# Patient Record
Sex: Male | Born: 1965 | Race: White | Hispanic: Yes | Marital: Married | Smoking: Never smoker
Health system: Southern US, Community
[De-identification: ages and names within clinical notes are randomized; demographics above are authoritative.]

## PROBLEM LIST (undated history)

## (undated) DIAGNOSIS — Z86010 Personal history of colonic polyps: Secondary | ICD-10-CM

## (undated) DIAGNOSIS — K760 Fatty (change of) liver, not elsewhere classified: Secondary | ICD-10-CM

## (undated) DIAGNOSIS — K5792 Diverticulitis of intestine, part unspecified, without perforation or abscess without bleeding: Secondary | ICD-10-CM

## (undated) HISTORY — DX: Fatty (change of) liver, not elsewhere classified: K76.0

## (undated) HISTORY — DX: Diverticulitis of intestine, part unspecified, without perforation or abscess without bleeding: K57.92

## (undated) HISTORY — DX: Personal history of colonic polyps: Z86.010

---

## 1998-01-14 ENCOUNTER — Encounter: Admission: RE | Admit: 1998-01-14 | Discharge: 1998-02-18 | Payer: Self-pay | Admitting: Orthopedic Surgery

## 1998-08-29 ENCOUNTER — Ambulatory Visit (HOSPITAL_COMMUNITY): Admission: RE | Admit: 1998-08-29 | Discharge: 1998-08-29 | Payer: Self-pay

## 1998-08-29 ENCOUNTER — Encounter: Payer: Self-pay | Admitting: Neurosurgery

## 1998-09-22 ENCOUNTER — Encounter: Payer: Self-pay | Admitting: Neurosurgery

## 1998-09-22 ENCOUNTER — Ambulatory Visit (HOSPITAL_COMMUNITY): Admission: RE | Admit: 1998-09-22 | Discharge: 1998-09-22 | Payer: Self-pay | Admitting: Neurosurgery

## 1999-06-19 ENCOUNTER — Ambulatory Visit (HOSPITAL_COMMUNITY): Admission: RE | Admit: 1999-06-19 | Discharge: 1999-06-19 | Payer: Self-pay | Admitting: Family Medicine

## 1999-06-19 ENCOUNTER — Encounter: Payer: Self-pay | Admitting: Family Medicine

## 2000-02-12 ENCOUNTER — Ambulatory Visit (HOSPITAL_COMMUNITY): Admission: RE | Admit: 2000-02-12 | Discharge: 2000-02-12 | Payer: Self-pay | Admitting: Neurosurgery

## 2000-02-12 ENCOUNTER — Encounter: Payer: Self-pay | Admitting: Neurosurgery

## 2002-03-27 ENCOUNTER — Emergency Department (HOSPITAL_COMMUNITY): Admission: EM | Admit: 2002-03-27 | Discharge: 2002-03-27 | Payer: Self-pay | Admitting: Emergency Medicine

## 2003-12-04 ENCOUNTER — Ambulatory Visit: Payer: Self-pay | Admitting: Family Medicine

## 2004-01-07 ENCOUNTER — Ambulatory Visit: Payer: Self-pay | Admitting: Family Medicine

## 2005-08-22 ENCOUNTER — Emergency Department (HOSPITAL_COMMUNITY): Admission: EM | Admit: 2005-08-22 | Discharge: 2005-08-22 | Payer: Self-pay | Admitting: Emergency Medicine

## 2008-04-29 ENCOUNTER — Emergency Department (HOSPITAL_COMMUNITY): Admission: EM | Admit: 2008-04-29 | Discharge: 2008-04-29 | Payer: Self-pay | Admitting: Emergency Medicine

## 2009-02-23 ENCOUNTER — Emergency Department (HOSPITAL_COMMUNITY): Admission: EM | Admit: 2009-02-23 | Discharge: 2009-02-23 | Payer: Self-pay | Admitting: Emergency Medicine

## 2009-09-06 ENCOUNTER — Emergency Department (HOSPITAL_COMMUNITY): Admission: EM | Admit: 2009-09-06 | Discharge: 2009-09-07 | Payer: Self-pay | Admitting: Emergency Medicine

## 2009-10-10 ENCOUNTER — Emergency Department (HOSPITAL_COMMUNITY): Admission: EM | Admit: 2009-10-10 | Discharge: 2009-10-10 | Payer: Self-pay | Admitting: Emergency Medicine

## 2009-10-12 ENCOUNTER — Emergency Department (HOSPITAL_COMMUNITY): Admission: EM | Admit: 2009-10-12 | Discharge: 2009-10-12 | Payer: Self-pay | Admitting: Emergency Medicine

## 2009-10-14 ENCOUNTER — Ambulatory Visit: Payer: Self-pay | Admitting: Internal Medicine

## 2009-10-24 ENCOUNTER — Ambulatory Visit: Payer: Self-pay | Admitting: Internal Medicine

## 2009-10-24 LAB — CONVERTED CEMR LAB
ALT: 28 units/L (ref 0–53)
AST: 23 units/L (ref 0–37)
Albumin: 4.8 g/dL (ref 3.5–5.2)
Alkaline Phosphatase: 74 units/L (ref 39–117)
BUN: 15 mg/dL (ref 6–23)
CO2: 29 meq/L (ref 19–32)
Calcium: 10.3 mg/dL (ref 8.4–10.5)
Chloride: 105 meq/L (ref 96–112)
Cholesterol: 214 mg/dL — ABNORMAL HIGH (ref 0–200)
Creatinine, Ser: 0.89 mg/dL (ref 0.40–1.50)
Glucose, Bld: 118 mg/dL — ABNORMAL HIGH (ref 70–99)
HDL: 41 mg/dL (ref >39)
LDL Cholesterol: 157 mg/dL — ABNORMAL HIGH (ref 0–99)
Potassium: 5.4 meq/L — ABNORMAL HIGH (ref 3.5–5.3)
Sodium: 140 meq/L (ref 135–145)
Total Bilirubin: 1.1 mg/dL (ref 0.3–1.2)
Total CHOL/HDL Ratio: 5.2
Total Protein: 7.6 g/dL (ref 6.0–8.3)
Triglycerides: 80 mg/dL (ref <150)
VLDL: 16 mg/dL (ref 0–40)

## 2009-12-09 ENCOUNTER — Ambulatory Visit: Payer: Self-pay | Admitting: Internal Medicine

## 2010-06-04 LAB — POCT I-STAT, CHEM 8
Calcium, Ion: 1.18 mmol/L (ref 1.12–1.32)
Creatinine, Ser: 0.8 mg/dL (ref 0.4–1.5)
Glucose, Bld: 179 mg/dL — ABNORMAL HIGH (ref 70–99)
Hemoglobin: 17.7 g/dL — ABNORMAL HIGH (ref 13.0–17.0)
Sodium: 140 mEq/L (ref 135–145)
TCO2: 27 mmol/L (ref 0–100)

## 2010-06-04 LAB — DIFFERENTIAL
Eosinophils Absolute: 0.1 10*3/uL (ref 0.0–0.7)
Eosinophils Relative: 2 % (ref 0–5)
Lymphs Abs: 2.9 10*3/uL (ref 0.7–4.0)
Monocytes Relative: 4 % (ref 3–12)

## 2010-06-04 LAB — POCT CARDIAC MARKERS
CKMB, poc: <1 ng/mL — ABNORMAL LOW (ref 1.0–8.0)
Myoglobin, poc: 35.4 ng/mL (ref 12–200)

## 2010-06-04 LAB — CBC
HCT: 47.8 % (ref 39.0–52.0)
MCV: 88.2 fL (ref 78.0–100.0)
Platelets: 272 10*3/uL (ref 150–400)
WBC: 6.7 10*3/uL (ref 4.0–10.5)

## 2010-06-24 ENCOUNTER — Emergency Department: Payer: Self-pay | Admitting: Emergency Medicine

## 2010-11-24 ENCOUNTER — Emergency Department (HOSPITAL_COMMUNITY): Payer: Self-pay

## 2010-11-24 ENCOUNTER — Emergency Department (HOSPITAL_COMMUNITY)
Admission: EM | Admit: 2010-11-24 | Discharge: 2010-11-25 | Disposition: A | Discharge status: Home / Self Care | Payer: Self-pay | Attending: Emergency Medicine | Admitting: Emergency Medicine

## 2010-11-24 DIAGNOSIS — R51 Headache: Secondary | ICD-10-CM | POA: Insufficient documentation

## 2010-11-24 DIAGNOSIS — R209 Unspecified disturbances of skin sensation: Secondary | ICD-10-CM | POA: Insufficient documentation

## 2010-11-24 DIAGNOSIS — M79609 Pain in unspecified limb: Secondary | ICD-10-CM | POA: Insufficient documentation

## 2010-11-24 LAB — APTT: aPTT: 34 seconds (ref 24–37)

## 2010-11-24 LAB — CBC
Hemoglobin: 16.6 g/dL (ref 13.0–17.0)
MCHC: 35.9 g/dL (ref 30.0–36.0)
RBC: 5.36 MIL/uL (ref 4.22–5.81)

## 2010-11-24 LAB — RAPID URINE DRUG SCREEN, HOSP PERFORMED
Amphetamines: NOT DETECTED
Barbiturates: NOT DETECTED
Benzodiazepines: NOT DETECTED
Tetrahydrocannabinol: NOT DETECTED

## 2010-11-24 LAB — BASIC METABOLIC PANEL
BUN: 9 mg/dL (ref 6–23)
CO2: 27 mEq/L (ref 19–32)
GFR calc non Af Amer: >90 mL/min (ref >90)
Glucose, Bld: 136 mg/dL — ABNORMAL HIGH (ref 70–99)
Potassium: 3.3 mEq/L — ABNORMAL LOW (ref 3.5–5.1)

## 2010-11-24 LAB — DIFFERENTIAL
Basophils Absolute: 0.1 10*3/uL (ref 0.0–0.1)
Basophils Relative: 1 % (ref 0–1)
Neutro Abs: 4.8 10*3/uL (ref 1.7–7.7)
Neutrophils Relative %: 55 % (ref 43–77)

## 2010-11-24 LAB — URINALYSIS, ROUTINE W REFLEX MICROSCOPIC
Bilirubin Urine: NEGATIVE
Hgb urine dipstick: NEGATIVE
Ketones, ur: NEGATIVE mg/dL
Nitrite: NEGATIVE
pH: 6.5 (ref 5.0–8.0)

## 2010-11-24 LAB — POCT I-STAT TROPONIN I: Troponin i, poc: 0 ng/mL (ref 0.00–0.08)

## 2010-11-24 LAB — PROTIME-INR: INR: 1.02 (ref 0.00–1.49)

## 2010-11-25 ENCOUNTER — Emergency Department (HOSPITAL_COMMUNITY): Payer: Self-pay

## 2010-11-25 ENCOUNTER — Other Ambulatory Visit (HOSPITAL_COMMUNITY): Payer: Self-pay

## 2010-11-25 DIAGNOSIS — G459 Transient cerebral ischemic attack, unspecified: Secondary | ICD-10-CM

## 2010-11-25 LAB — HEMOGLOBIN A1C
Hgb A1c MFr Bld: 6.2 % — ABNORMAL HIGH (ref <5.7)
Mean Plasma Glucose: 131 mg/dL — ABNORMAL HIGH (ref <117)

## 2010-11-25 LAB — LIPID PANEL
Cholesterol: 220 mg/dL — ABNORMAL HIGH (ref 0–200)
Total CHOL/HDL Ratio: 5.2 RATIO

## 2013-03-31 ENCOUNTER — Emergency Department (HOSPITAL_COMMUNITY): Payer: Medicaid Other

## 2013-03-31 ENCOUNTER — Emergency Department (HOSPITAL_COMMUNITY)
Admission: EM | Admit: 2013-03-31 | Discharge: 2013-04-01 | Disposition: A | Discharge status: Home / Self Care | Payer: Medicaid Other | Attending: Emergency Medicine | Admitting: Emergency Medicine

## 2013-03-31 ENCOUNTER — Encounter (HOSPITAL_COMMUNITY): Payer: Self-pay | Admitting: Emergency Medicine

## 2013-03-31 DIAGNOSIS — K5792 Diverticulitis of intestine, part unspecified, without perforation or abscess without bleeding: Secondary | ICD-10-CM

## 2013-03-31 DIAGNOSIS — K5732 Diverticulitis of large intestine without perforation or abscess without bleeding: Secondary | ICD-10-CM | POA: Insufficient documentation

## 2013-03-31 LAB — POCT I-STAT, CHEM 8
BUN: 10 mg/dL (ref 6–23)
CALCIUM ION: 1.12 mmol/L (ref 1.12–1.23)
Chloride: 100 mEq/L (ref 96–112)
Creatinine, Ser: 0.7 mg/dL (ref 0.50–1.35)
Glucose, Bld: 170 mg/dL — ABNORMAL HIGH (ref 70–99)
HEMATOCRIT: 54 % — AB (ref 39.0–52.0)
Hemoglobin: 18.4 g/dL — ABNORMAL HIGH (ref 13.0–17.0)
Potassium: 4.7 mEq/L (ref 3.7–5.3)
Sodium: 137 mEq/L (ref 137–147)
TCO2: 28 mmol/L (ref 0–100)

## 2013-03-31 LAB — CBC
HEMATOCRIT: 48.4 % (ref 39.0–52.0)
Hemoglobin: 17.3 g/dL — ABNORMAL HIGH (ref 13.0–17.0)
MCH: 31 pg (ref 26.0–34.0)
MCHC: 35.7 g/dL (ref 30.0–36.0)
MCV: 86.7 fL (ref 78.0–100.0)
Platelets: 255 10*3/uL (ref 150–400)
RBC: 5.58 MIL/uL (ref 4.22–5.81)
RDW: 12.4 % (ref 11.5–15.5)
WBC: 14 10*3/uL — ABNORMAL HIGH (ref 4.0–10.5)

## 2013-03-31 LAB — OCCULT BLOOD, POC DEVICE: FECAL OCCULT BLD: NEGATIVE

## 2013-03-31 MED ORDER — FENTANYL CITRATE 0.05 MG/ML IJ SOLN
50.0000 ug | INTRAMUSCULAR | Status: DC | PRN
  Administered 2013-03-31: 50 ug via INTRAVENOUS
  Filled 2013-03-31 (×2): qty 2

## 2013-03-31 MED ORDER — SODIUM CHLORIDE 0.9 % IV SOLN
INTRAVENOUS | Status: DC
  Administered 2013-03-31: via INTRAVENOUS

## 2013-03-31 MED ORDER — IOHEXOL 300 MG/ML  SOLN
50.0000 mL | Freq: Once | INTRAMUSCULAR | Status: AC | PRN
  Administered 2013-03-31: 50 mL via ORAL

## 2013-03-31 MED ORDER — ONDANSETRON HCL 4 MG/2ML IJ SOLN
4.0000 mg | Freq: Once | INTRAMUSCULAR | Status: AC
  Administered 2013-03-31: 4 mg via INTRAVENOUS
  Filled 2013-03-31: qty 2

## 2013-03-31 NOTE — ED Provider Notes (Signed)
CSN: [id number]     Arrival date & time 03/31/13  2111 History   First MD Initiated Contact with Patient 03/31/13 2259     Chief Complaint  Patient presents with  . Rectal Bleeding   (Consider location/radiation/quality/duration/timing/severity/associated sxs/prior Treatment) HPI Hx per PT - lower ABD pain onset yesterday mild more severe today with constipation - small hard stool with streaks of blood. No F/C, no N/V, pain is sharp and constant with tenesmus. No h/o same. No radiation of pain, some urinary frequency. No dysuria or hematuria. Last BM today.   History reviewed. No pertinent past medical history. History reviewed. No pertinent past surgical history. History reviewed. No pertinent family history. History  Substance Use Topics  . Smoking status: Never Smoker   . Smokeless tobacco: Not on file  . Alcohol Use: Yes    Review of Systems  Constitutional: Negative for fever and chills.  Respiratory: Negative for shortness of breath.   Cardiovascular: Negative for chest pain.  Gastrointestinal: Positive for abdominal pain, constipation and blood in stool. Negative for vomiting.  Genitourinary: Negative for hematuria and flank pain.  Musculoskeletal: Negative for back pain, neck pain and neck stiffness.  Skin: Negative for rash.  Neurological: Negative for headaches.  All other systems reviewed and are negative.    Allergies  Codeine  Home Medications   Current Outpatient Rx  Name  Route  Sig  Dispense  Refill  . ibuprofen (ADVIL,MOTRIN) 200 MG tablet   Oral   Take 600 mg by mouth every 6 (six) hours as needed (pain).          BP 143/81  Pulse 98  Temp(Src) 100.8 F (38.2 C) (Oral)  Resp 18  SpO2 98% Physical Exam  Constitutional: He is oriented to person, place, and time. He appears well-developed and well-nourished.  HENT:  Head: Normocephalic and atraumatic.  Mouth/Throat: Oropharynx is clear and moist.  Eyes: EOM are normal. Pupils are equal, round,  and reactive to light. No scleral icterus.  Neck: Neck supple.  Cardiovascular: Normal rate, regular rhythm and intact distal pulses.   Pulmonary/Chest: Effort normal and breath sounds normal. No respiratory distress.  Abdominal: Soft. Bowel sounds are normal. He exhibits no distension.  TTP RLQ/ LLQ and midline lower ABD. No fullness over the bladder  Musculoskeletal: Normal range of motion. He exhibits no edema.  Neurological: He is alert and oriented to person, place, and time. No cranial nerve deficit.  Skin: Skin is warm and dry.    ED Course  Procedures (including critical care time) Labs Review Labs Reviewed  CBC - Abnormal; Notable for the following:    WBC 14.0 (*)    Hemoglobin 17.3 (*)    All other components within normal limits  COMPREHENSIVE METABOLIC PANEL - Abnormal; Notable for the following:    Glucose, Bld 175 (*)    All other components within normal limits  POCT I-STAT, CHEM 8 - Abnormal; Notable for the following:    Glucose, Bld 170 (*)    Hemoglobin 18.4 (*)    HCT 54.0 (*)    All other components within normal limits  URINALYSIS, ROUTINE W REFLEX MICROSCOPIC  OCCULT BLOOD, POC DEVICE  TYPE AND SCREEN  ABO/RH   Imaging Review Ct Abdomen Pelvis W Contrast  04/01/2013   CLINICAL DATA:  48 year old male with abdominal and pelvic pain with rectal bleeding.  EXAM: CT ABDOMEN AND PELVIS WITH CONTRAST  TECHNIQUE: Multidetector CT imaging of the abdomen and pelvis was performed using the  standard protocol following bolus administration of intravenous contrast.  CONTRAST:  100 cc intravenous Omnipaque 300  COMPARISON:  None.  FINDINGS: Focal wall thickening of the mid sigmoid colon with adjacent inflammation is compatible with diverticulitis. There is no evidence of pneumoperitoneum, bowel obstruction or focal abscess.  Diffuse fatty infiltration of the liver is noted.  The gallbladder, spleen, pancreas, adrenal glands and kidneys are unremarkable.  The bladder and  appendix are unremarkable.  There is no evidence of free fluid, enlarged lymph nodes, biliary dilation or abdominal aortic aneurysm.  No acute bony abnormalities are present. 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  IMPRESSION: Diverticulitis of the mid sigmoid colon without pneumoperitoneum or focal abscess. Clinical follow-up to resolution is recommended.  Diffuse fatty infiltration of the liver.  Bilateral L5 pars defects.   Electronically Signed   By: Hassan Rowan M.D.   On: 04/01/2013 00:34   IV fluids. IV fentanyl. Zofran  CT scan reviewed and Cipro and Flagyl provided 2:34 AM on recheck pain is improving and patient feels comfortable with plan discharge home. He agrees to strict return precautions for any fevers, severe pain, vomiting. Outpatient referral provided. Plan prescription for Cipro, Flagyl and hydrocodone as needed.   MDM  Diagnosis: Diverticulitis  Symptoms improved with IV fluids and IV narcotics. PO ABx provided Evaluated with labs, urinalysis and CT scan reviewed as above Serial evaluations. Vital signs and nursing notes reviewed and considered.  [name], MD 04/01/13 [phone]

## 2013-03-31 NOTE — ED Notes (Signed)
Pt arrived to the ED with a complaint of rectal bleeding.  Pt states he has lower abdominal pain.  Pt states he has not had a normal bowel movement in two weeks.  Pt states at present his bowel movements are small clear streaked with blood.  Pt has been drinking water and has been able to urinate fine but urination causes him abdominal pain.

## 2013-04-01 LAB — URINALYSIS, ROUTINE W REFLEX MICROSCOPIC
Bilirubin Urine: NEGATIVE
Glucose, UA: NEGATIVE mg/dL
Hgb urine dipstick: NEGATIVE
Ketones, ur: NEGATIVE mg/dL
LEUKOCYTES UA: NEGATIVE
NITRITE: NEGATIVE
PH: 6.5 (ref 5.0–8.0)
Protein, ur: NEGATIVE mg/dL
Specific Gravity, Urine: 1.008 (ref 1.005–1.030)
UROBILINOGEN UA: 0.2 mg/dL (ref 0.0–1.0)

## 2013-04-01 LAB — COMPREHENSIVE METABOLIC PANEL
ALBUMIN: 4.7 g/dL (ref 3.5–5.2)
ALK PHOS: 110 U/L (ref 39–117)
ALT: 32 U/L (ref 0–53)
AST: 21 U/L (ref 0–37)
BUN: 9 mg/dL (ref 6–23)
CO2: 25 mEq/L (ref 19–32)
Calcium: 9.7 mg/dL (ref 8.4–10.5)
Chloride: 96 mEq/L (ref 96–112)
Creatinine, Ser: 0.7 mg/dL (ref 0.50–1.35)
GFR calc Af Amer: >90 mL/min (ref >90)
GFR calc non Af Amer: >90 mL/min (ref >90)
Glucose, Bld: 175 mg/dL — ABNORMAL HIGH (ref 70–99)
Potassium: 3.7 mEq/L (ref 3.7–5.3)
Sodium: 137 mEq/L (ref 137–147)
TOTAL PROTEIN: 8.1 g/dL (ref 6.0–8.3)
Total Bilirubin: 1.2 mg/dL (ref 0.3–1.2)

## 2013-04-01 LAB — TYPE AND SCREEN
ABO/RH(D): O POS
Antibody Screen: NEGATIVE

## 2013-04-01 LAB — ABO/RH: ABO/RH(D): O POS

## 2013-04-01 MED ORDER — METRONIDAZOLE 500 MG PO TABS: 500.0000 mg | ORAL_TABLET | Freq: Two times a day (BID) | ORAL | Status: DC

## 2013-04-01 MED ORDER — CIPROFLOXACIN HCL 500 MG PO TABS: 500.0000 mg | ORAL_TABLET | Freq: Two times a day (BID) | ORAL | Status: DC

## 2013-04-01 MED ORDER — IOHEXOL 300 MG/ML  SOLN
100.0000 mL | Freq: Once | INTRAMUSCULAR | Status: AC | PRN
  Administered 2013-04-01: 100 mL via INTRAVENOUS

## 2013-04-01 MED ORDER — CIPROFLOXACIN HCL 500 MG PO TABS
500.0000 mg | ORAL_TABLET | Freq: Once | ORAL | Status: AC
  Administered 2013-04-01: 500 mg via ORAL
  Filled 2013-04-01: qty 1

## 2013-04-01 MED ORDER — HYDROCODONE-ACETAMINOPHEN 5-325 MG PO TABS: 2.0000 | ORAL_TABLET | ORAL | Status: DC | PRN

## 2013-04-01 MED ORDER — METRONIDAZOLE 500 MG PO TABS
500.0000 mg | ORAL_TABLET | Freq: Once | ORAL | Status: AC
  Administered 2013-04-01: 500 mg via ORAL
  Filled 2013-04-01: qty 1

## 2013-04-01 NOTE — Discharge Instructions (Signed)
Diverticulitis °A diverticulum is a small pouch or sac on the colon. Diverticulosis is the presence of these diverticula on the colon. Diverticulitis is the irritation (inflammation) or infection of diverticula. °CAUSES  °The colon and its diverticula contain bacteria. If food particles block the tiny opening to a diverticulum, the bacteria inside can grow and cause an increase in pressure. This leads to infection and inflammation and is called diverticulitis. °SYMPTOMS  °· Abdominal pain and tenderness. Usually, the pain is located on the left side of your abdomen. However, it could be located elsewhere. °· Fever. °· Bloating. °· Feeling sick to your stomach (nausea). °· Throwing up (vomiting). °· Abnormal stools. °DIAGNOSIS  °Your caregiver will take a history and perform a physical exam. Since many things can cause abdominal pain, other tests may be necessary. Tests may include: °· Blood tests. °· Urine tests. °· X-ray of the abdomen. °· CT scan of the abdomen. °Sometimes, surgery is needed to determine if diverticulitis or other conditions are causing your symptoms. °TREATMENT  °Most of the time, you can be treated without surgery. Treatment includes: °· Resting the bowels by only having liquids for a few days. As you improve, you will need to eat a low-fiber diet. °· Intravenous (IV) fluids if you are losing body fluids (dehydrated). °· Antibiotic medicines that treat infections may be given. °· Pain and nausea medicine, if needed. °· Surgery if the inflamed diverticulum has burst. °HOME CARE INSTRUCTIONS  °· Try a clear liquid diet (broth, tea, or water for as long as directed by your caregiver). You may then gradually begin a low-fiber diet as tolerated.  °A low-fiber diet is a diet with less than 10 grams of fiber. Choose the foods below to reduce fiber in the diet: °· White breads, cereals, rice, and pasta. °· Cooked fruits and vegetables or soft fresh fruits and vegetables without the skin. °· Ground or  well-cooked tender beef, ham, veal, lamb, pork, or poultry. °· Eggs and seafood. °· After your diverticulitis symptoms have improved, your caregiver may put you on a high-fiber diet. A high-fiber diet includes 14 grams of fiber for every 1000 calories consumed. For a standard 2000 calorie diet, you would need 28 grams of fiber. Follow these diet guidelines to help you increase the fiber in your diet. It is important to slowly increase the amount fiber in your diet to avoid gas, constipation, and bloating. °· Choose whole-grain breads, cereals, pasta, and brown rice. °· Choose fresh fruits and vegetables with the skin on. Do not overcook vegetables because the more vegetables are cooked, the more fiber is lost. °· Choose more nuts, seeds, legumes, dried peas, beans, and lentils. °· Look for food products that have greater than 3 grams of fiber per serving on the Nutrition Facts label. °· Take all medicine as directed by your caregiver. °· If your caregiver has given you a follow-up appointment, it is very important that you go. Not going could result in lasting (chronic) or permanent injury, pain, and disability. If there is any problem keeping the appointment, call to reschedule. °SEEK MEDICAL CARE IF:  °· Your pain does not improve. °· You have a hard time advancing your diet beyond clear liquids. °· Your bowel movements do not return to normal. °SEEK IMMEDIATE MEDICAL CARE IF:  °· Your pain becomes worse. °· You have an oral temperature above 102° F (38.9° C), not controlled by medicine. °· You have repeated vomiting. °· You have bloody or black, tarry stools. °·   Symptoms that brought you to your caregiver become worse or are not getting better. °MAKE SURE YOU:  °· Understand these instructions. °· Will watch your condition. °· Will get help right away if you are not doing well or get worse. °Document Released: 11/18/2004 Document Revised: 05/03/2011 Document Reviewed: 03/16/2010 °[employer]® Patient Information  ©2014 [employer]. ° °

## 2013-05-29 ENCOUNTER — Encounter: Payer: Self-pay | Admitting: Internal Medicine

## 2013-05-29 ENCOUNTER — Encounter (HOSPITAL_COMMUNITY): Payer: Self-pay | Admitting: Emergency Medicine

## 2013-05-29 ENCOUNTER — Emergency Department (HOSPITAL_COMMUNITY)
Admission: EM | Admit: 2013-05-29 | Discharge: 2013-05-29 | Disposition: A | Discharge status: Home / Self Care | Payer: Medicaid Other | Attending: Emergency Medicine | Admitting: Emergency Medicine

## 2013-05-29 DIAGNOSIS — K5732 Diverticulitis of large intestine without perforation or abscess without bleeding: Secondary | ICD-10-CM | POA: Insufficient documentation

## 2013-05-29 DIAGNOSIS — K5792 Diverticulitis of intestine, part unspecified, without perforation or abscess without bleeding: Secondary | ICD-10-CM

## 2013-05-29 LAB — CBC WITH DIFFERENTIAL/PLATELET
BASOS PCT: 1 % (ref 0–1)
Basophils Absolute: 0.1 10*3/uL (ref 0.0–0.1)
EOS ABS: 0.1 10*3/uL (ref 0.0–0.7)
Eosinophils Relative: 1 % (ref 0–5)
HCT: 47.3 % (ref 39.0–52.0)
Hemoglobin: 16.5 g/dL (ref 13.0–17.0)
Lymphocytes Relative: 28 % (ref 12–46)
Lymphs Abs: 3.3 10*3/uL (ref 0.7–4.0)
MCH: 30.3 pg (ref 26.0–34.0)
MCHC: 34.9 g/dL (ref 30.0–36.0)
MCV: 86.9 fL (ref 78.0–100.0)
Monocytes Absolute: 0.8 10*3/uL (ref 0.1–1.0)
Monocytes Relative: 6 % (ref 3–12)
NEUTROS ABS: 7.7 10*3/uL (ref 1.7–7.7)
NEUTROS PCT: 64 % (ref 43–77)
Platelets: 285 10*3/uL (ref 150–400)
RBC: 5.44 MIL/uL (ref 4.22–5.81)
RDW: 12.4 % (ref 11.5–15.5)
WBC: 12 10*3/uL — ABNORMAL HIGH (ref 4.0–10.5)

## 2013-05-29 LAB — COMPREHENSIVE METABOLIC PANEL
ALBUMIN: 4 g/dL (ref 3.5–5.2)
ALK PHOS: 108 U/L (ref 39–117)
ALT: 28 U/L (ref 0–53)
AST: 23 U/L (ref 0–37)
BUN: 15 mg/dL (ref 6–23)
CO2: 26 mEq/L (ref 19–32)
CREATININE: 0.97 mg/dL (ref 0.50–1.35)
Calcium: 9.5 mg/dL (ref 8.4–10.5)
Chloride: 99 mEq/L (ref 96–112)
GFR calc Af Amer: >90 mL/min (ref >90)
GFR calc non Af Amer: >90 mL/min (ref >90)
Glucose, Bld: 228 mg/dL — ABNORMAL HIGH (ref 70–99)
POTASSIUM: 4.1 meq/L (ref 3.7–5.3)
Sodium: 138 mEq/L (ref 137–147)
TOTAL PROTEIN: 7.4 g/dL (ref 6.0–8.3)
Total Bilirubin: 0.5 mg/dL (ref 0.3–1.2)

## 2013-05-29 LAB — URINALYSIS, ROUTINE W REFLEX MICROSCOPIC
Bilirubin Urine: NEGATIVE
Hgb urine dipstick: NEGATIVE
KETONES UR: NEGATIVE mg/dL
LEUKOCYTES UA: NEGATIVE
NITRITE: NEGATIVE
PH: 6 (ref 5.0–8.0)
Protein, ur: NEGATIVE mg/dL
SPECIFIC GRAVITY, URINE: 1.036 — AB (ref 1.005–1.030)
Urobilinogen, UA: 1 mg/dL (ref 0.0–1.0)

## 2013-05-29 LAB — URINE MICROSCOPIC-ADD ON

## 2013-05-29 LAB — LIPASE, BLOOD: LIPASE: 37 U/L (ref 11–59)

## 2013-05-29 MED ORDER — CIPROFLOXACIN HCL 500 MG PO TABS
500.0000 mg | ORAL_TABLET | Freq: Once | ORAL | Status: AC
  Administered 2013-05-29: 500 mg via ORAL
  Filled 2013-05-29: qty 1

## 2013-05-29 MED ORDER — PROMETHAZINE HCL 25 MG PO TABS: 25.0000 mg | ORAL_TABLET | Freq: Four times a day (QID) | ORAL | Status: DC | PRN

## 2013-05-29 MED ORDER — METRONIDAZOLE 500 MG PO TABS: 500.0000 mg | ORAL_TABLET | Freq: Two times a day (BID) | ORAL | Status: DC

## 2013-05-29 MED ORDER — METRONIDAZOLE 500 MG PO TABS
500.0000 mg | ORAL_TABLET | Freq: Once | ORAL | Status: AC
  Administered 2013-05-29: 500 mg via ORAL
  Filled 2013-05-29: qty 1

## 2013-05-29 MED ORDER — CIPROFLOXACIN HCL 500 MG PO TABS: 500.0000 mg | ORAL_TABLET | Freq: Two times a day (BID) | ORAL | Status: DC

## 2013-05-29 NOTE — ED Provider Notes (Signed)
CSN: [id number]     Arrival date & time 05/29/13  0000 History   First MD Initiated Contact with Patient 05/29/13 [phone]     Chief Complaint  Patient presents with  . Abdominal Pain     (Consider location/radiation/quality/duration/timing/severity/associated sxs/prior Treatment) HPI History provided by patient. Left lower quadrant abdominal pain onset yesterday, persisted today. He has Vicodin at home from previous bout of diverticulitis and took some with moderate relief of symptoms. No fevers or chills. No nausea vomiting. No diarrhea or constipation but has some discomfort with bowel movements. No blood in stools. Patient worried that he has recurrent diverticulitis with the same presentation 03/31/13. Pain is sharp in quality and not radiating. No known alleviating factors. No primary care physician. With last bout of diverticulitis was treated at home with antibiotics  - symptoms improved within a few days.  History reviewed. No pertinent past medical history. History reviewed. No pertinent past surgical history. History reviewed. No pertinent family history. History  Substance Use Topics  . Smoking status: Never Smoker   . Smokeless tobacco: Not on file  . Alcohol Use: Yes    Review of Systems  Constitutional: Negative for fever and chills.  Respiratory: Negative for shortness of breath.   Cardiovascular: Negative for chest pain.  Gastrointestinal: Positive for abdominal pain. Negative for vomiting and blood in stool.  Genitourinary: Negative for difficulty urinating.  Musculoskeletal: Negative for back pain.  Skin: Negative for rash.  Neurological: Negative for headaches.  All other systems reviewed and are negative.      Allergies  Codeine  Home Medications   Current Outpatient Rx  Name  Route  Sig  Dispense  Refill  . acetaminophen (TYLENOL) 500 MG tablet   Oral   Take 1,000 mg by mouth every 6 (six) hours as needed (for pain).         Marland Kitchen  HYDROcodone-acetaminophen (NORCO/VICODIN) 5-325 MG per tablet   Oral   Take 2 tablets by mouth every 4 (four) hours as needed.   10 tablet   0   . ibuprofen (ADVIL,MOTRIN) 200 MG tablet   Oral   Take 400 mg by mouth every 6 (six) hours as needed (for pain).          BP 134/82  Pulse 86  Temp(Src) 98 F (36.7 C) (Oral)  Resp 17  Ht 5\' 5"  (1.651 m)  Wt 180 lb (81.647 kg)  BMI 29.95 kg/m2  SpO2 97% Physical Exam  Constitutional: He is oriented to person, place, and time. He appears well-developed and well-nourished.  HENT:  Head: Normocephalic and atraumatic.  Eyes: EOM are normal. Pupils are equal, round, and reactive to light.  Neck: Neck supple.  Cardiovascular: Normal rate, regular rhythm and intact distal pulses.   Pulmonary/Chest: Effort normal and breath sounds normal. No respiratory distress. He exhibits no tenderness.  Abdominal: Soft. Bowel sounds are normal. He exhibits no distension. There is no rebound and no guarding.  Tender to palpation left lower quadrant without rebound or guarding. No abdominal tenderness otherwise  Musculoskeletal: Normal range of motion. He exhibits no edema and no tenderness.  Neurological: He is alert and oriented to person, place, and time. No cranial nerve deficit.  Skin: Skin is warm and dry.    ED Course  Procedures (including critical care time) Labs Review Labs Reviewed  CBC WITH DIFFERENTIAL - Abnormal; Notable for the following:    WBC 12.0 (*)    All other components within normal limits  COMPREHENSIVE METABOLIC  PANEL - Abnormal; Notable for the following:    Glucose, Bld 228 (*)    All other components within normal limits  LIPASE, BLOOD  URINALYSIS, ROUTINE W REFLEX MICROSCOPIC   Patient declines any pain medication at this time. Patient feels comfortable with plan treatment for diverticulitis without repeat CT scan at this time. He is afebrile. No nausea vomiting. Has clinical diverticulitis. he agrees to outpatient  followup with [facility] to [facility]. GI referral also provided. He has Medicaid pending.  Patient agrees to strict return precautions for any fevers, vomiting, severe pain or any worsening condition.  MDM   Diagnosis: Diverticulitis  Labs reviewed as above. Does not require pain medications. No fever or emesis. Appropriate for outpatient management. Vital signs and nursing notes reviewed and considered    [name], MD 05/29/13 [phone]

## 2013-05-29 NOTE — ED Notes (Signed)
Pt states that he has had L abdominal pain radiating down to his groin and has had only scant urine coming out at times. Also reports he has had to have a BM with little coming out and feels like he has 'too much acid' around his anus after he uses the bathroom. Alert and oriented.

## 2013-05-29 NOTE — ED Notes (Signed)
Pt stated that he cannot urinate.   Will try again in 30 minutes. 

## 2013-05-29 NOTE — Discharge Instructions (Signed)
Diverticulitis °A diverticulum is a small pouch or sac on the colon. Diverticulosis is the presence of these diverticula on the colon. Diverticulitis is the irritation (inflammation) or infection of diverticula. °CAUSES  °The colon and its diverticula contain bacteria. If food particles block the tiny opening to a diverticulum, the bacteria inside can grow and cause an increase in pressure. This leads to infection and inflammation and is called diverticulitis. °SYMPTOMS  °· Abdominal pain and tenderness. Usually, the pain is located on the left side of your abdomen. However, it could be located elsewhere. °· Fever. °· Bloating. °· Feeling sick to your stomach (nausea). °· Throwing up (vomiting). °· Abnormal stools. °DIAGNOSIS  °Your caregiver will take a history and perform a physical exam. Since many things can cause abdominal pain, other tests may be necessary. Tests may include: °· Blood tests. °· Urine tests. °· X-ray of the abdomen. °· CT scan of the abdomen. °Sometimes, surgery is needed to determine if diverticulitis or other conditions are causing your symptoms. °TREATMENT  °Most of the time, you can be treated without surgery. Treatment includes: °· Resting the bowels by only having liquids for a few days. As you improve, you will need to eat a low-fiber diet. °· Intravenous (IV) fluids if you are losing body fluids (dehydrated). °· Antibiotic medicines that treat infections may be given. °· Pain and nausea medicine, if needed. °· Surgery if the inflamed diverticulum has burst. °HOME CARE INSTRUCTIONS  °· Try a clear liquid diet (broth, tea, or water for as long as directed by your caregiver). You may then gradually begin a low-fiber diet as tolerated.  °A low-fiber diet is a diet with less than 10 grams of fiber. Choose the foods below to reduce fiber in the diet: °· White breads, cereals, rice, and pasta. °· Cooked fruits and vegetables or soft fresh fruits and vegetables without the skin. °· Ground or  well-cooked tender beef, ham, veal, lamb, pork, or poultry. °· Eggs and seafood. °· After your diverticulitis symptoms have improved, your caregiver may put you on a high-fiber diet. A high-fiber diet includes 14 grams of fiber for every 1000 calories consumed. For a standard 2000 calorie diet, you would need 28 grams of fiber. Follow these diet guidelines to help you increase the fiber in your diet. It is important to slowly increase the amount fiber in your diet to avoid gas, constipation, and bloating. °· Choose whole-grain breads, cereals, pasta, and brown rice. °· Choose fresh fruits and vegetables with the skin on. Do not overcook vegetables because the more vegetables are cooked, the more fiber is lost. °· Choose more nuts, seeds, legumes, dried peas, beans, and lentils. °· Look for food products that have greater than 3 grams of fiber per serving on the Nutrition Facts label. °· Take all medicine as directed by your caregiver. °· If your caregiver has given you a follow-up appointment, it is very important that you go. Not going could result in lasting (chronic) or permanent injury, pain, and disability. If there is any problem keeping the appointment, call to reschedule. °SEEK MEDICAL CARE IF:  °· Your pain does not improve. °· You have a hard time advancing your diet beyond clear liquids. °· Your bowel movements do not return to normal. °SEEK IMMEDIATE MEDICAL CARE IF:  °· Your pain becomes worse. °· You have an oral temperature above 102° F (38.9° C), not controlled by medicine. °· You have repeated vomiting. °· You have bloody or black, tarry stools. °·   Symptoms that brought you to your caregiver become worse or are not getting better. °MAKE SURE YOU:  °· Understand these instructions. °· Will watch your condition. °· Will get help right away if you are not doing well or get worse. °Document Released: 11/18/2004 Document Revised: 05/03/2011 Document Reviewed: 03/16/2010 °[employer]® Patient Information  ©2014 [employer]. ° °

## 2013-06-14 ENCOUNTER — Encounter: Payer: Self-pay | Admitting: Gastroenterology

## 2013-07-02 ENCOUNTER — Encounter: Payer: Self-pay | Admitting: Internal Medicine

## 2013-07-02 ENCOUNTER — Ambulatory Visit (INDEPENDENT_AMBULATORY_CARE_PROVIDER_SITE_OTHER): Payer: Medicaid Other | Admitting: Internal Medicine

## 2013-07-02 VITALS — BP 120/72 | HR 68 | Ht 65.0 in | Wt 191.2 lb

## 2013-07-02 DIAGNOSIS — K5732 Diverticulitis of large intestine without perforation or abscess without bleeding: Secondary | ICD-10-CM

## 2013-07-02 DIAGNOSIS — K625 Hemorrhage of anus and rectum: Secondary | ICD-10-CM

## 2013-07-02 MED ORDER — MOVIPREP 100 G PO SOLR: ORAL | Status: DC

## 2013-07-02 MED ORDER — AMOXICILLIN-POT CLAVULANATE 875-125 MG PO TABS: 1.0000 | ORAL_TABLET | Freq: Two times a day (BID) | ORAL | Status: DC

## 2013-07-02 NOTE — Patient Instructions (Addendum)
We have sent the following medications to your pharmacy for you to pick up at your convenience: Augmentin  You have been scheduled for a colonoscopy with propofol. Please follow written instructions given to you at your visit today.  Please pick up your prep kit at the pharmacy within the next 1-3 days. If you use inhalers (even only as needed), please bring them with you on the day of your procedure.  I appreciate the opportunity to care for you.

## 2013-07-02 NOTE — Progress Notes (Signed)
         Subjective:    Patient ID: Marcus Mason, male    DOB: [birth year], 48 y.o.   MRN: [id number]  HPI The patient is a middle-aged [place] man here with his wife, because of persistent LLQ pain. Had diverticulitis of sigmoid dx at CT scan/ED visit 03/2013. Also having rectal bleeding then. He was Rx with cipro and metronidazole and got betterbut had more problems in April, seen in ED and Tx again with cipro/metroidazole. Has had loose and urgent stools which are improving. Still having a soreness in LLQ worse after meal, somewhat better with defecation and may also worsen with twisting of trunk.  All other GI ROS negative  Allergies  Allergen Reactions  . Codeine Other (See Comments)    unknown   No meds now Past Medical History  Diagnosis Date  . Fatty liver   . Diverticulitis    History reviewed. No pertinent past surgical history. History   Social History  . Marital Status: Married    Spouse Name: N/A    Number of Children: 6  . Years of Education: N/A   Occupational History  . Handyman    Social History Main Topics  . Smoking status: Never Smoker   . Smokeless tobacco: Never Used  . Alcohol Use: Yes  . Drug Use: No  . Sexual Activity: Yes    Social History Narrative   Married - 5 sons 1 daughter   Handyman, Animator, [employer]   2 caffeine/day   Family History  Problem Relation Age of Onset  . Diabetes Maternal Aunt     x2     Review of Systems This is positive for those things mentiones in the HPI. All other review of systems are negative.      Objective:   Physical Exam General:  Well-developed, well-nourished and in no acute distress Eyes:  anicteric. ENT:   Mouth and posterior pharynx free of lesions.  Neck:   supple w/o thyromegaly or mass.  Lungs: Clear to auscultation bilaterally. Heart:  S1S2, no rubs, murmurs, gallops. Abdomen:  soft, mildly tender LLQ , no hepatosplenomegaly, hernia, or mass and BS+. No abd wall  tenderness Rectal: Deferred until colonoscopy Lymph:  no cervical or supraclavicular adenopathy. Extremities:   no edema Skin   no rash. Neuro:  A&O x 3.  Psych:  appropriate mood and  Affect.   Data Reviewed: ED notes, CT scan, labs Lab Results  Component Value Date   WBC 12.0* 05/29/2013   HGB 16.5 05/29/2013   HCT 47.3 05/29/2013   MCV 86.9 05/29/2013   PLT 285 05/29/2013      Assessment & Plan:  Diverticulitis large intestine - Plan: amoxicillin-clavulanate (AUGMENTIN) 875-125 MG per tablet  Rectal bleeding - Plan: Ambulatory referral to Gastroenterology - colonoscopy  1. Decent story for diverticulitis but ? Occult neoplasm instead 2. tx w/ amox/clavulanate 875 mg bid x 10 days then  3. Colonoscopy The risks and benefits as well as alternatives of endoscopic procedure(s) have been discussed and reviewed. All questions answered. The patient agrees to proceed.   SW:FUXNAT,FTDDUK Danne Baxter, MD

## 2013-07-23 ENCOUNTER — Ambulatory Visit (AMBULATORY_SURGERY_CENTER): Payer: Medicaid Other | Admitting: Internal Medicine

## 2013-07-23 ENCOUNTER — Encounter: Payer: Self-pay | Admitting: Internal Medicine

## 2013-07-23 VITALS — BP 119/80 | HR 70 | Temp 98.6°F | Resp 16 | Ht 65.0 in | Wt 191.0 lb

## 2013-07-23 DIAGNOSIS — D126 Benign neoplasm of colon, unspecified: Secondary | ICD-10-CM

## 2013-07-23 DIAGNOSIS — K648 Other hemorrhoids: Secondary | ICD-10-CM

## 2013-07-23 DIAGNOSIS — K625 Hemorrhage of anus and rectum: Secondary | ICD-10-CM

## 2013-07-23 DIAGNOSIS — Z860100 Personal history of colon polyps, unspecified: Secondary | ICD-10-CM | POA: Insufficient documentation

## 2013-07-23 DIAGNOSIS — K5730 Diverticulosis of large intestine without perforation or abscess without bleeding: Secondary | ICD-10-CM | POA: Insufficient documentation

## 2013-07-23 HISTORY — PX: COLONOSCOPY W/ BIOPSIES: SHX1374

## 2013-07-23 HISTORY — DX: Personal history of colon polyps, unspecified: Z86.0100

## 2013-07-23 HISTORY — DX: Personal history of colonic polyps: Z86.010

## 2013-07-23 MED ORDER — DICYCLOMINE HCL 20 MG PO TABS: 20.0000 mg | ORAL_TABLET | Freq: Four times a day (QID) | ORAL | Status: DC | PRN

## 2013-07-23 MED ORDER — SODIUM CHLORIDE 0.9 % IV SOLN: 500.0000 mL | INTRAVENOUS | Status: DC

## 2013-07-23 NOTE — Op Note (Signed)
[place]  [address] [place], [id number]   COLONOSCOPY PROCEDURE REPORT  PATIENT: Marcus Mason, Marcus Mason.  MR#: [id number] BIRTHDATE: 12/26/1965 , 48  yrs. old GENDER: Male ENDOSCOPIST: Gatha Mayer, MD, [facility] PROCEDURE DATE:  07/23/2013 PROCEDURE:   Colonoscopy, diagnostic First Screening Colonoscopy - Avg.  risk and is 50 yrs.  old or older - No.  Prior Negative Screening - Now for repeat screening. N/A  History of Adenoma - Now for follow-up colonoscopy & has been > or = to 3 yrs.  N/A  Polyps Removed Today? Yes. ASA CLASS:   Class II INDICATIONS:Rectal Bleeding. MEDICATIONS: Propofol (Diprivan) 340 mg IV, MAC sedation, administered by CRNA, and These medications were titrated to patient response per physician's verbal order DESCRIPTION OF PROCEDURE:   After the risks benefits and alternatives of the procedure were thoroughly explained, informed consent was obtained.  A digital rectal exam revealed no abnormalities of the rectum, A digital rectal exam revealed no prostatic nodules, and A digital rectal exam revealed the prostate was not enlarged.   The LB [id number]  endoscope was introduced through the anus and advanced to the cecum, which was identified by both the appendix and ileocecal valve. No adverse events experienced.   The quality of the prep was excellent, using MoviPrep  The instrument was then slowly withdrawn as the colon was fully examined.  COLON FINDINGS: Two sessile polyps measuring 5 and 7 mm in size were found in the ascending colon and descending colon.  A polypectomy was performed with a cold snare.  The resection was complete and the polyp tissue was completely retrieved.   There was severe diverticulosis noted in the sigmoid colon with associated muscular hypertrophy, luminal narrowing and petechiae.   Internal hemorrhoids were found.   The colon mucosa was otherwise normal. A right colon retroflexion was performed.   Retroflexed views revealed internal hemorrhoids. The time to cecum=3 minutes 37 seconds.  Withdrawal time=10 minutes 13 seconds.  The scope was withdrawn and the procedure completed. COMPLICATIONS: There were no complications.  ENDOSCOPIC IMPRESSION: 1.   Two sessile polyps measuring 5 and 7 mm in size were found in the ascending colon and descending colon; polypectomy was performed with a cold snare 2.   There was severe diverticulosis noted in the sigmoid colon 3.   Internal hemorrhoids 4.   The colon mucosa was otherwise normal - excellent prep RECOMMENDATIONS: 1.  Timing of repeat colonoscopy will be determined by pathology findings. 2.   Dicyclomine 20 mg q 6 hrs prn LLQ pain Follow-up me as needed 3.    If hemorrhoids cause more problems consider banding eSigned:  Gatha Mayer, MD, [facility] 07/23/2013 4:47 PM  cc: Claris Gower, MD and The Patient

## 2013-07-23 NOTE — Progress Notes (Signed)
Called to room to assist during endoscopic procedure.  Patient ID and intended procedure confirmed with present staff. Received instructions for my participation in the procedure from the performing physician.  

## 2013-07-23 NOTE — Patient Instructions (Addendum)
No cancer. I found and removed two polyps. I saw diverticulosis but no diverticulitis. You have hemorrhoids that caused the bleeding.  I think your pain is coming from spasm of muscles in the colon. You may also have some sore muscles in the wall of the abdomen.  I am prescribing a medication called dicyclomine for you to try for the pain. It sometimes makes people dizzy so please try it out before you get up on stilts or ladders and make sure it does not bother you.  If you are not doing ok after trying this medicine then come back to see me.  I will let you know pathology results and when to have another routine colonoscopy by mail.  I appreciate the opportunity to care for you. Gatha Mayer, MD, FACG   YOU HAD AN ENDOSCOPIC PROCEDURE TODAY AT [facility]: Refer to the procedure report that was given to you for any specific questions about what was found during the examination.  If the procedure report does not answer your questions, please call your gastroenterologist to clarify.  If you requested that your care partner not be given the details of your procedure findings, then the procedure report has been included in a sealed envelope for you to review at your convenience later.  YOU SHOULD EXPECT: Some feelings of bloating in the abdomen. Passage of more gas than usual.  Walking can help get rid of the air that was put into your GI tract during the procedure and reduce the bloating. If you had a lower endoscopy (such as a colonoscopy or flexible sigmoidoscopy) you may notice spotting of blood in your stool or on the toilet paper. If you underwent a bowel prep for your procedure, then you may not have a normal bowel movement for a few days.  DIET: Your first meal following the procedure should be a light meal and then it is ok to progress to your normal diet.  A half-sandwich or bowl of soup is an example of a good first meal.  Heavy or fried foods are harder to digest  and may make you feel nauseous or bloated.  Likewise meals heavy in dairy and vegetables can cause extra gas to form and this can also increase the bloating.  Drink plenty of fluids but you should avoid alcoholic beverages for 24 hours.  ACTIVITY: Your care partner should take you home directly after the procedure.  You should plan to take it easy, moving slowly for the rest of the day.  You can resume normal activity the day after the procedure however you should NOT DRIVE or use heavy machinery for 24 hours (because of the sedation medicines used during the test).    SYMPTOMS TO REPORT IMMEDIATELY: A gastroenterologist can be reached at any hour.  During normal business hours, 8:30 AM to 5:00 PM Monday through Friday, call [phone].  After hours and on weekends, please call the GI answering service at [phone] who will take a message and have the physician on call contact you.   Following lower endoscopy (colonoscopy or flexible sigmoidoscopy):  Excessive amounts of blood in the stool  Significant tenderness or worsening of abdominal pains  Swelling of the abdomen that is new, acute  Fever of 100F or higher    FOLLOW UP: If any biopsies were taken you will be contacted by phone or by letter within the next 1-3 weeks.  Call your gastroenterologist if you have not heard about the biopsies  in 3 weeks.  Our staff will call the home number listed on your records the next business day following your procedure to check on you and address any questions or concerns that you may have at that time regarding the information given to you following your procedure. This is a courtesy call and so if there is no answer at the home number and we have not heard from you through the emergency physician on call, we will assume that you have returned to your regular daily activities without incident.  SIGNATURES/CONFIDENTIALITY: You and/or your care partner have signed paperwork which will be entered  into your electronic medical record.  These signatures attest to the fact that that the information above on your After Visit Summary has been reviewed and is understood.  Full responsibility of the confidentiality of this discharge information lies with you and/or your care-partner.  INFORMATION ON DIVERTICULOSIS GIVEN TO YOU TODAY  DICYCLOMINE (TAKE AS ORDERED) ORDER SENT TO YOUR PHARMACY [place] [facility] [place],

## 2013-07-24 ENCOUNTER — Telehealth: Payer: Self-pay

## 2013-07-24 NOTE — Telephone Encounter (Signed)
  Follow up Call-  Call back number 07/23/2013  Post procedure Call Back phone  # [phone]  Permission to leave phone message Yes     Patient questions:  Do you have a fever, pain , or abdominal swelling? no Pain Score  0 *  Have you tolerated food without any problems? yes  Have you been able to return to your normal activities? yes  Do you have any questions about your discharge instructions: Diet   no Medications  no Follow up visit  no  Do you have questions or concerns about your Care? no  Actions: * If pain score is 4 or above: No action needed, pain <4.  Per the pt he said he did well last night.  The only sx he has is a runny nose.  I explained he had O2 through his nose during the procedure.  Some pt will have nasal congestion form that.  I advised to call back if sx worsen.  He said he would. Maw

## 2013-08-01 ENCOUNTER — Encounter: Payer: Self-pay | Admitting: Internal Medicine

## 2013-08-01 NOTE — Progress Notes (Signed)
Quick Note:  Tubular adenoma and mucosal polyp max 7 mm Repeat colon 2020 ______

## 2013-08-08 ENCOUNTER — Ambulatory Visit: Payer: Self-pay | Admitting: Internal Medicine

## 2013-11-14 ENCOUNTER — Encounter: Payer: Self-pay | Admitting: Internal Medicine

## 2013-11-14 ENCOUNTER — Ambulatory Visit (INDEPENDENT_AMBULATORY_CARE_PROVIDER_SITE_OTHER): Payer: Medicaid Other | Admitting: Internal Medicine

## 2013-11-14 VITALS — BP 122/70 | HR 84 | Ht 63.75 in | Wt 183.0 lb

## 2013-11-14 DIAGNOSIS — K5732 Diverticulitis of large intestine without perforation or abscess without bleeding: Secondary | ICD-10-CM

## 2013-11-14 MED ORDER — CIPROFLOXACIN HCL 500 MG PO TABS: 500.0000 mg | ORAL_TABLET | Freq: Two times a day (BID) | ORAL | Status: DC

## 2013-11-14 MED ORDER — METRONIDAZOLE 500 MG PO TABS
500.0000 mg | ORAL_TABLET | Freq: Four times a day (QID) | ORAL | Status: DC
Start: 2013-11-14 — End: 2015-03-09

## 2013-11-14 NOTE — Patient Instructions (Signed)
We have sent the following medications to your pharmacy for you to pick up at your convenience: Flagyl 500 mg four times daily x 10 days Cipro 500 mg twice daily x 10 days  Please follow up as needed.  Thank you for choosing [facility].

## 2013-11-14 NOTE — Progress Notes (Signed)
   Subjective:    Patient ID: Marcus Mason, male    DOB: [birth year], 48 y.o.   MRN: [id number]  HPI The patient is here for f/u diverticulitis - treated 3 x before this visit. CT dx 03/2013. Says Dr. Arelia Sneddon treated with cipro and metronidazole 1 month ago and he is better but still having some LLQ discomfort.  Medications, allergies, past medical history, past surgical history, family history and social history are reviewed and updated in the EMR.   Review of Systems As above    Objective:   Physical Exam General:  NAD Abdomen:  soft and mildy tender LLQ, BS+   Data Reviewed:   Prior notes, labs CT       Assessment & Plan:

## 2013-11-16 DIAGNOSIS — K5732 Diverticulitis of large intestine without perforation or abscess without bleeding: Secondary | ICD-10-CM | POA: Insufficient documentation

## 2013-11-16 NOTE — Assessment & Plan Note (Signed)
He may have low grade recurrence/persistence Retreat with cipro and metronidazole F/U PRN If recurrence would repeat a CT

## 2014-09-04 ENCOUNTER — Emergency Department (HOSPITAL_COMMUNITY): Payer: Medicaid Other

## 2014-09-04 ENCOUNTER — Encounter (HOSPITAL_COMMUNITY): Payer: Self-pay | Admitting: Family Medicine

## 2014-09-04 ENCOUNTER — Emergency Department (HOSPITAL_COMMUNITY)
Admission: EM | Admit: 2014-09-04 | Discharge: 2014-09-04 | Disposition: A | Discharge status: Home / Self Care | Payer: Medicaid Other | Attending: Emergency Medicine | Admitting: Emergency Medicine

## 2014-09-04 DIAGNOSIS — Y998 Other external cause status: Secondary | ICD-10-CM | POA: Insufficient documentation

## 2014-09-04 DIAGNOSIS — Z86010 Personal history of colonic polyps: Secondary | ICD-10-CM | POA: Diagnosis not present

## 2014-09-04 DIAGNOSIS — S61032A Puncture wound without foreign body of left thumb without damage to nail, initial encounter: Secondary | ICD-10-CM | POA: Diagnosis not present

## 2014-09-04 DIAGNOSIS — W260XXA Contact with knife, initial encounter: Secondary | ICD-10-CM | POA: Diagnosis not present

## 2014-09-04 DIAGNOSIS — Y9289 Other specified places as the place of occurrence of the external cause: Secondary | ICD-10-CM | POA: Insufficient documentation

## 2014-09-04 DIAGNOSIS — Z79899 Other long term (current) drug therapy: Secondary | ICD-10-CM | POA: Insufficient documentation

## 2014-09-04 DIAGNOSIS — Y9389 Activity, other specified: Secondary | ICD-10-CM | POA: Diagnosis not present

## 2014-09-04 DIAGNOSIS — S61239A Puncture wound without foreign body of unspecified finger without damage to nail, initial encounter: Secondary | ICD-10-CM

## 2014-09-04 DIAGNOSIS — S6992XA Unspecified injury of left wrist, hand and finger(s), initial encounter: Secondary | ICD-10-CM | POA: Diagnosis present

## 2014-09-04 DIAGNOSIS — Z8719 Personal history of other diseases of the digestive system: Secondary | ICD-10-CM | POA: Insufficient documentation

## 2014-09-04 MED ORDER — IBUPROFEN 600 MG PO TABS: 600.0000 mg | ORAL_TABLET | Freq: Four times a day (QID) | ORAL | Status: DC | PRN

## 2014-09-04 MED ORDER — CEPHALEXIN 500 MG PO CAPS: 500.0000 mg | ORAL_CAPSULE | Freq: Four times a day (QID) | ORAL | Status: DC

## 2014-09-04 NOTE — ED Provider Notes (Signed)
CSN: [id number]     Arrival date & time 09/04/14  1218 History  This chart was scribed for non-physician practitioner, Renold Genta, PA-C, working with Pamella Pert, MD by Ladene Artist, ED Scribe. This patient was seen in room TR08C/TR08C and the patient's care was started at 2:03 PM.   Chief Complaint  Patient presents with  . Finger Injury   The history is provided by the patient. No language interpreter was used.   HPI Comments: Marcus Mason is a 49 y.o. male who presents to the Emergency Department complaining of a laceration to left thumb sustained 2 days ago. Pt unintentionally cut his thumb with a knife 2 days ago. He reports constant pain that is exacerbated with movement and palpation, as well as gradually worsening swelling. Bleeding is controlled. He cleaned the area last night. Pt's tetanus is UTD.   Past Medical History  Diagnosis Date  . Fatty liver   . Diverticulitis   . Personal history of colonic polyp - adenoma 07/23/2013   Past Surgical History  Procedure Laterality Date  . Colonoscopy w/ biopsies  07/23/2013   Family History  Problem Relation Age of Onset  . Diabetes Maternal Aunt     x2   History  Substance Use Topics  . Smoking status: Never Smoker   . Smokeless tobacco: Never Used  . Alcohol Use: Yes     Comment: RARELT    Review of Systems  Musculoskeletal: Positive for joint swelling and arthralgias.  Skin: Positive for wound.   Allergies  Codeine  Home Medications   Prior to Admission medications   Medication Sig Start Date End Date Taking? Authorizing Provider  acetaminophen (TYLENOL) 500 MG tablet Take 1,000 mg by mouth every 6 (six) hours as needed (for pain).    Historical Provider, MD  ciprofloxacin (CIPRO) 500 MG tablet Take 1 tablet (500 mg total) by mouth 2 (two) times daily. 11/14/13   Gatha Mayer, MD  dicyclomine (BENTYL) 20 MG tablet Take 1 tablet (20 mg total) by mouth every 6 (six) hours as needed for spasms  (abdominal pain). 07/23/13   Gatha Mayer, MD  HYDROcodone-acetaminophen (NORCO/VICODIN) 5-325 MG per tablet Take 2 tablets by mouth every 4 (four) hours as needed. 04/01/13   [name], MD  ibuprofen (ADVIL,MOTRIN) 200 MG tablet Take 400 mg by mouth every 6 (six) hours as needed (for pain).    Historical Provider, MD  MELOXICAM PO Take by mouth.    Historical Provider, MD  metroNIDAZOLE (FLAGYL) 500 MG tablet Take 1 tablet (500 mg total) by mouth 4 (four) times daily. 11/14/13   Gatha Mayer, MD   BP 127/69 mmHg  Pulse 62  Temp(Src) 97.9 F (36.6 C) (Oral)  Resp 18  Ht 5\' 5"  (1.651 m)  Wt 176 lb 1.6 oz (79.878 kg)  BMI 29.30 kg/m2  SpO2 97% Physical Exam  Constitutional: He is oriented to person, place, and time. He appears well-developed and well-nourished. No distress.  HENT:  Head: Normocephalic and atraumatic.  Eyes: Conjunctivae and EOM are normal.  Neck: Neck supple. No tracheal deviation present.  Cardiovascular: Normal rate.   Pulmonary/Chest: Effort normal. No respiratory distress.  Musculoskeletal: Normal range of motion.  Puncture wound to the left thumb, 0.5 cm entrance wound to the dorsal proximal thumb with a pinpoint exit wound to the palmar proximal thumb. He was static. Full range of motion of the thumb at MCP and IP joints with full strength against resistance with flexion  and extension. Capillary refill less than 2 seconds. Normal sensation over her dorsal and palmar surfaces of the thumb.  Neurological: He is alert and oriented to person, place, and time.  Skin: Skin is warm and dry.  Psychiatric: He has a normal mood and affect. His behavior is normal.  Nursing note and vitals reviewed.  ED Course  Procedures (including critical care time) DIAGNOSTIC STUDIES: Oxygen Saturation is 97% on RA, normal by my interpretation.    COORDINATION OF CARE: 2:06 PM-Discussed treatment plan which includes XR, Keflex and 600 mg ibuprofen with pt at bedside and pt agreed to  plan.   Labs Review Labs Reviewed - No data to display  Imaging Review Dg Finger Thumb Left  09/04/2014   CLINICAL DATA:  Laceration should be IP joint of the LEFT thumb. Initial encounter.  EXAM: LEFT THUMB 2+V  COMPARISON:  None.  FINDINGS: There is no evidence of fracture or dislocation. There is no evidence of arthropathy or other focal bone abnormality. Soft tissues are unremarkable  IMPRESSION: Negative.   Electronically Signed   By: Dereck Ligas M.D.   On: 09/04/2014 13:37    EKG Interpretation None      MDM   Final diagnoses:  Puncture wound of finger of left hand, initial encounter    Patient with stab wound through and through to the left thumb. Wound is 58 days old. X-ray obtained to rule out bony involvement and is negative. There is some swelling to the thumb, patient is worried about infection. Will start on Keflex. Advised ice, elevate, wound care at home. Follow-up as needed.  Filed Vitals:   09/04/14 1311 09/04/14 1417  BP: 127/69 120/74  Pulse: 62 64  Temp: 97.9 F (36.6 C) 97.5 F (36.4 C)  TempSrc: Oral Oral  Resp: 18 16  Height: 5\' 5"  (1.651 m)   Weight: 176 lb 1.6 oz (79.878 kg)   SpO2: 97% 99%    I personally performed the services described in this documentation, which was scribed in my presence. The recorded information has been reviewed and is accurate.   Jeannett Senior, PA-C 09/04/14 [place], MD 09/06/14 [phone]

## 2014-09-04 NOTE — ED Notes (Signed)
Pt here for laceration to left thumb. sts accidentally cut with a knife Monday. Now finger swollen and painful. Pt able to move finger and sensation in tact.

## 2014-09-04 NOTE — Discharge Instructions (Signed)
Ibuprofen for pain. Keflex for infection. Keep finger clean, wash with soap and water. Apply Triple Antibiotic ointment 3 times a day. Follow-up with your doctor or return to emergency department if worsening.   Puncture Wound A puncture wound is an injury that extends through all layers of the skin and into the tissue beneath the skin (subcutaneous tissue). Puncture wounds become infected easily because germs often enter the body and go beneath the skin during the injury. Having a deep wound with a small entrance point makes it difficult for your caregiver to adequately clean the wound. This is especially true if you have stepped on a nail and it has passed through a dirty shoe or other situations where the wound is obviously contaminated. CAUSES  Many puncture wounds involve glass, nails, splinters, fish hooks, or other objects that enter the skin (foreign bodies). A puncture wound may also be caused by a human bite or animal bite. DIAGNOSIS  A puncture wound is usually diagnosed by your history and a physical exam. You may need to have an X-ray or an ultrasound to check for any foreign bodies still in the wound. TREATMENT   Your caregiver will clean the wound as thoroughly as possible. Depending on the location of the wound, a bandage (dressing) may be applied.  Your caregiver might prescribe antibiotic medicines.  You may need a follow-up visit to check on your wound. Follow all instructions as directed by your caregiver. HOME CARE INSTRUCTIONS   Change your dressing once per day, or as directed by your caregiver. If the dressing sticks, it may be removed by soaking the area in water.  If your caregiver has given you follow-up instructions, it is very important that you return for a follow-up appointment. Not following up as directed could result in a chronic or permanent injury, pain, and disability.  Only take over-the-counter or prescription medicines for pain, discomfort, or fever as  directed by your caregiver.  If you are given antibiotics, take them as directed. Finish them even if you start to feel better. You may need a tetanus shot if:  You cannot remember when you had your last tetanus shot.  You have never had a tetanus shot. If you got a tetanus shot, your arm may swell, get red, and feel warm to the touch. This is common and not a problem. If you need a tetanus shot and you choose not to have one, there is a rare chance of getting tetanus. Sickness from tetanus can be serious. You may need a rabies shot if an animal bite caused your puncture wound. SEEK MEDICAL CARE IF:   You have redness, swelling, or increasing pain in the wound.  You have red streaks going away from the wound.  You notice a bad smell coming from the wound or dressing.  You have yellowish-white fluid (pus) coming from the wound.  You are treated with an antibiotic for infection, but the infection is not getting better.  You notice something in the wound, such as rubber from your shoe, cloth, or another object.  You have a fever.  You have severe pain.  You have difficulty breathing.  You feel dizzy or faint.  You cannot stop vomiting.  You lose feeling, develop numbness, or cannot move a limb below the wound.  Your symptoms worsen. MAKE SURE YOU:  Understand these instructions.  Will watch your condition.  Will get help right away if you are not doing well or get worse. Document Released: 11/18/2004  Document Revised: 05/03/2011 Document Reviewed: 07/28/2010 [facility] Patient Information 2015 [place], [place]. This information is not intended to replace advice given to you by your health care provider. Make sure you discuss any questions you have with your health care provider.

## 2015-02-12 ENCOUNTER — Encounter (HOSPITAL_COMMUNITY): Payer: Self-pay | Admitting: Emergency Medicine

## 2015-02-12 ENCOUNTER — Emergency Department (HOSPITAL_COMMUNITY)
Admission: EM | Admit: 2015-02-12 | Discharge: 2015-02-12 | Disposition: A | Discharge status: Home / Self Care | Payer: Medicaid Other | Attending: Emergency Medicine | Admitting: Emergency Medicine

## 2015-02-12 DIAGNOSIS — S6992XA Unspecified injury of left wrist, hand and finger(s), initial encounter: Secondary | ICD-10-CM | POA: Insufficient documentation

## 2015-02-12 DIAGNOSIS — Z86010 Personal history of colonic polyps: Secondary | ICD-10-CM | POA: Insufficient documentation

## 2015-02-12 DIAGNOSIS — Z792 Long term (current) use of antibiotics: Secondary | ICD-10-CM | POA: Diagnosis not present

## 2015-02-12 DIAGNOSIS — Z8719 Personal history of other diseases of the digestive system: Secondary | ICD-10-CM | POA: Diagnosis not present

## 2015-02-12 DIAGNOSIS — Y998 Other external cause status: Secondary | ICD-10-CM | POA: Diagnosis not present

## 2015-02-12 DIAGNOSIS — Z791 Long term (current) use of non-steroidal anti-inflammatories (NSAID): Secondary | ICD-10-CM | POA: Insufficient documentation

## 2015-02-12 DIAGNOSIS — W270XXA Contact with workbench tool, initial encounter: Secondary | ICD-10-CM | POA: Insufficient documentation

## 2015-02-12 DIAGNOSIS — Y9389 Activity, other specified: Secondary | ICD-10-CM | POA: Diagnosis not present

## 2015-02-12 DIAGNOSIS — Y9289 Other specified places as the place of occurrence of the external cause: Secondary | ICD-10-CM | POA: Diagnosis not present

## 2015-02-12 MED ORDER — BACITRACIN ZINC 500 UNIT/GM EX OINT
1.0000 "application " | TOPICAL_OINTMENT | Freq: Two times a day (BID) | CUTANEOUS | Status: DC
  Administered 2015-02-12: 1 via TOPICAL
  Filled 2015-02-12: qty 0.9

## 2015-02-12 NOTE — ED Provider Notes (Signed)
CSN: [id number]     Arrival date & time 02/12/15  [id number] History  By signing my name below, I, Helane Gunther, attest that this documentation has been prepared under the direction and in the presence of Montine Circle, PA-C. Electronically Signed: Helane Gunther, ED Scribe. 02/12/2015. 6:52 PM.     Chief Complaint  Patient presents with  . Hand Injury   The history is provided by the patient. No language interpreter was used.   HPI Comments: Marcus Mason is a 49 y.o. male with a PMHx of fatty liver, diverticulitis, and colonic polyp (adenoma) who presents to the Emergency Department complaining of a laceration to the left thumb sustained just PTA. Pt states he cut his hand on a skill saw when it struck a nail and bounced back, lacerating his hand. He notes he is UTD on his tetanus. He states he has taken tylenol for pain, as well as applying something against infection that also serves to numb the area it is applied to.   Past Medical History  Diagnosis Date  . Fatty liver   . Diverticulitis   . Personal history of colonic polyp - adenoma 07/23/2013   Past Surgical History  Procedure Laterality Date  . Colonoscopy w/ biopsies  07/23/2013   Family History  Problem Relation Age of Onset  . Diabetes Maternal Aunt     x2   Social History  Substance Use Topics  . Smoking status: Never Smoker   . Smokeless tobacco: Never Used  . Alcohol Use: Yes     Comment: RARELT    Review of Systems  Skin: Positive for wound.    Allergies  Codeine  Home Medications   Prior to Admission medications   Medication Sig Start Date End Date Taking? Authorizing Provider  acetaminophen (TYLENOL) 500 MG tablet Take 1,000 mg by mouth every 6 (six) hours as needed (for pain).    Historical Provider, MD  cephALEXin (KEFLEX) 500 MG capsule Take 1 capsule (500 mg total) by mouth 4 (four) times daily. 09/04/14   [name], PA-C  ciprofloxacin (CIPRO) 500 MG tablet Take 1 tablet (500 mg total)  by mouth 2 (two) times daily. 11/14/13   Gatha Mayer, MD  dicyclomine (BENTYL) 20 MG tablet Take 1 tablet (20 mg total) by mouth every 6 (six) hours as needed for spasms (abdominal pain). 07/23/13   Gatha Mayer, MD  HYDROcodone-acetaminophen (NORCO/VICODIN) 5-325 MG per tablet Take 2 tablets by mouth every 4 (four) hours as needed. 04/01/13   [name], MD  ibuprofen (ADVIL,MOTRIN) 200 MG tablet Take 400 mg by mouth every 6 (six) hours as needed (for pain).    Historical Provider, MD  ibuprofen (ADVIL,MOTRIN) 600 MG tablet Take 1 tablet (600 mg total) by mouth every 6 (six) hours as needed. 09/04/14   [name], PA-C  MELOXICAM PO Take by mouth.    Historical Provider, MD  metroNIDAZOLE (FLAGYL) 500 MG tablet Take 1 tablet (500 mg total) by mouth 4 (four) times daily. 11/14/13   Gatha Mayer, MD   BP 142/88 mmHg  Pulse 68  Temp(Src) 97.8 F (36.6 C) (Oral)  Resp 16  Ht 5\' 5"  (1.651 m)  Wt 175 lb (79.379 kg)  BMI 29.12 kg/m2  SpO2 100% Physical Exam  Constitutional: He is oriented to person, place, and time. He appears well-developed and well-nourished.  HENT:  Head: Normocephalic and atraumatic.  Eyes: Conjunctivae are normal. Right eye exhibits no discharge. Left eye exhibits no discharge.  Cardiovascular: Intact distal pulses.   Pulmonary/Chest: Effort normal. No respiratory distress.  Musculoskeletal:  Strength 5/5, no evidence of ligament or tendon injury  Neurological: He is alert and oriented to person, place, and time. Coordination normal.  Sensation intact  Skin: Skin is warm and dry. No rash noted. He is not diaphoretic. No erythema.  No laceration requiring repair. Wound as pictured below:   Psychiatric: He has a normal mood and affect.  Nursing note and vitals reviewed.      ED Course  Procedures  DIAGNOSTIC STUDIES: Oxygen Saturation is 100% on RA, normal by my interpretation.    COORDINATION OF CARE: 6:49 PM - Discussed plans to cleanse and bandage  the wound. Will apply antibiotic ointment. Advised pt to keep the wound clean and bandaged for the next 7-10 days. Pt advised of plan for treatment and pt agrees.   MDM   Final diagnoses:  Hand injury, left, initial encounter    Patient with mild left hand injury. The circular saw that he was using kicked back and struck his left hand. Thankfully, no deep laceration. Nothing requiring repair. Hand was irrigated, cleansed in the ED. Bacitracin and bandaging applied. No evidence of tendon or ligament injury. Patient has normal strength and range of motion. I suspect that the injury will heal well without further follow-up. If symptoms change or worsen, patient is encouraged to follow-up with the hand specialist or return to the emergency department.  I personally performed the services described in this documentation, which was scribed in my presence. The recorded information has been reviewed and is accurate.     Montine Circle, PA-C 02/12/15 1937  Leo Grosser, MD 02/13/15 [phone]

## 2015-02-12 NOTE — ED Notes (Signed)
Pt to ER with hand injury to left hand after getting it caught on a skill saw. ROM intact as well as sensation. Pt is a/o x4. Gauze dressing present.

## 2015-02-12 NOTE — Discharge Instructions (Signed)
Wound Care °Taking care of your wound properly can help to prevent pain and infection. It can also help your wound to heal more quickly.  °HOW TO CARE FOR YOUR WOUND  °· Take or apply over-the-counter and prescription medicines only as told by your health care provider. °· If you were prescribed antibiotic medicine, take or apply it as told by your health care provider. Do not stop using the antibiotic even if your condition improves. °· Clean the wound each day or as told by your health care provider. °¨ Wash the wound with mild soap and water. °¨ Rinse the wound with water to remove all soap. °¨ Pat the wound dry with a clean towel. Do not rub it. °· There are many different ways to close and cover a wound. For example, a wound can be covered with stitches (sutures), skin glue, or adhesive strips. Follow instructions from your health care provider about: °¨ How to take care of your wound. °¨ When and how you should change your bandage (dressing). °¨ When you should remove your dressing. °¨ Removing whatever was used to close your wound. °· Check your wound every day for signs of infection. Watch for: °¨ Redness, swelling, or pain. °¨ Fluid, blood, or pus. °· Keep the dressing dry until your health care provider says it can be removed. Do not take baths, swim, use a hot tub, or do anything that would put your wound underwater until your health care provider approves. °· Raise (elevate) the injured area above the level of your heart while you are sitting or lying down. °· Do not scratch or pick at the wound. °· Keep all follow-up visits as told by your health care provider. This is important. °SEEK MEDICAL CARE IF: °· You received a tetanus shot and you have swelling, severe pain, redness, or bleeding at the injection site. °· You have a fever. °· Your pain is not controlled with medicine. °· You have increased redness, swelling, or pain at the site of your wound. °· You have fluid, blood, or pus coming from your  wound. °· You notice a bad smell coming from your wound or your dressing. °SEEK IMMEDIATE MEDICAL CARE IF: °· You have a red streak going away from your wound. °  °This information is not intended to replace advice given to you by your health care provider. Make sure you discuss any questions you have with your health care provider. °  °Document Released: 11/18/2007 Document Revised: 06/25/2014 Document Reviewed: 02/04/2014 °Elsevier Interactive Patient Education ©2016 [employer] ° °

## 2015-02-12 NOTE — ED Notes (Signed)
See PA assessment 

## 2015-03-09 ENCOUNTER — Emergency Department (HOSPITAL_COMMUNITY)
Admission: EM | Admit: 2015-03-09 | Discharge: 2015-03-10 | Disposition: A | Discharge status: Home / Self Care | Payer: Medicaid Other | Attending: Emergency Medicine | Admitting: Emergency Medicine

## 2015-03-09 ENCOUNTER — Encounter (HOSPITAL_COMMUNITY): Payer: Self-pay | Admitting: Emergency Medicine

## 2015-03-09 ENCOUNTER — Emergency Department (HOSPITAL_COMMUNITY): Payer: Medicaid Other

## 2015-03-09 DIAGNOSIS — K5793 Diverticulitis of intestine, part unspecified, without perforation or abscess with bleeding: Secondary | ICD-10-CM | POA: Diagnosis not present

## 2015-03-09 DIAGNOSIS — Z79899 Other long term (current) drug therapy: Secondary | ICD-10-CM | POA: Insufficient documentation

## 2015-03-09 DIAGNOSIS — R1032 Left lower quadrant pain: Secondary | ICD-10-CM | POA: Diagnosis present

## 2015-03-09 DIAGNOSIS — Z86010 Personal history of colonic polyps: Secondary | ICD-10-CM | POA: Diagnosis not present

## 2015-03-09 DIAGNOSIS — K5792 Diverticulitis of intestine, part unspecified, without perforation or abscess without bleeding: Secondary | ICD-10-CM

## 2015-03-09 LAB — CBC
HCT: 47.9 % (ref 39.0–52.0)
Hemoglobin: 16.6 g/dL (ref 13.0–17.0)
MCH: 30.1 pg (ref 26.0–34.0)
MCHC: 34.7 g/dL (ref 30.0–36.0)
MCV: 86.9 fL (ref 78.0–100.0)
PLATELETS: 263 10*3/uL (ref 150–400)
RBC: 5.51 MIL/uL (ref 4.22–5.81)
RDW: 12.3 % (ref 11.5–15.5)
WBC: 8.5 10*3/uL (ref 4.0–10.5)

## 2015-03-09 LAB — COMPREHENSIVE METABOLIC PANEL
ALBUMIN: 4.1 g/dL (ref 3.5–5.0)
ALK PHOS: 91 U/L (ref 38–126)
ALT: 26 U/L (ref 17–63)
AST: 23 U/L (ref 15–41)
Anion gap: 11 (ref 5–15)
BILIRUBIN TOTAL: 0.7 mg/dL (ref 0.3–1.2)
BUN: 14 mg/dL (ref 6–20)
CO2: 23 mmol/L (ref 22–32)
CREATININE: 0.66 mg/dL (ref 0.61–1.24)
Calcium: 9.4 mg/dL (ref 8.9–10.3)
Chloride: 103 mmol/L (ref 101–111)
GFR calc Af Amer: >60 mL/min (ref >60)
GLUCOSE: 272 mg/dL — AB (ref 65–99)
POTASSIUM: 3.5 mmol/L (ref 3.5–5.1)
Sodium: 137 mmol/L (ref 135–145)
TOTAL PROTEIN: 6.8 g/dL (ref 6.5–8.1)

## 2015-03-09 LAB — URINALYSIS, ROUTINE W REFLEX MICROSCOPIC
Bilirubin Urine: NEGATIVE
Hgb urine dipstick: NEGATIVE
KETONES UR: 15 mg/dL — AB
Leukocytes, UA: NEGATIVE
NITRITE: NEGATIVE
PROTEIN: NEGATIVE mg/dL
Specific Gravity, Urine: 1.037 — ABNORMAL HIGH (ref 1.005–1.030)
pH: 6 (ref 5.0–8.0)

## 2015-03-09 LAB — URINE MICROSCOPIC-ADD ON: Bacteria, UA: NONE SEEN

## 2015-03-09 LAB — LIPASE, BLOOD: Lipase: 37 U/L (ref 11–51)

## 2015-03-09 MED ORDER — FENTANYL CITRATE (PF) 100 MCG/2ML IJ SOLN
50.0000 ug | Freq: Once | INTRAMUSCULAR | Status: AC
  Administered 2015-03-09: 50 ug via INTRAVENOUS
  Filled 2015-03-09: qty 2

## 2015-03-09 MED ORDER — ONDANSETRON HCL 4 MG/2ML IJ SOLN
4.0000 mg | Freq: Once | INTRAMUSCULAR | Status: AC
  Administered 2015-03-09: 4 mg via INTRAVENOUS
  Filled 2015-03-09: qty 2

## 2015-03-09 MED ORDER — SODIUM CHLORIDE 0.9 % IV BOLUS (SEPSIS)
1000.0000 mL | Freq: Once | INTRAVENOUS | Status: AC
  Administered 2015-03-09: 1000 mL via INTRAVENOUS

## 2015-03-09 MED ORDER — IOHEXOL 300 MG/ML  SOLN
100.0000 mL | Freq: Once | INTRAMUSCULAR | Status: AC | PRN
  Administered 2015-03-09: 100 mL via INTRAVENOUS

## 2015-03-09 MED ORDER — IOHEXOL 300 MG/ML  SOLN
25.0000 mL | Freq: Once | INTRAMUSCULAR | Status: DC | PRN
  Administered 2015-03-09: 25 mL via ORAL

## 2015-03-09 NOTE — ED Notes (Signed)
L sided abd pain since Wednesday.  Reports nausea today.  States he has diverticulitis and believes it has flared up.

## 2015-03-09 NOTE — ED Provider Notes (Signed)
CSN: [id number]     Arrival date & time 03/09/15  [id number] History   First MD Initiated Contact with Patient 03/09/15 2105     Chief Complaint  Patient presents with  . Abdominal Pain     (Consider location/radiation/quality/duration/timing/severity/associated sxs/prior Treatment) HPI   GI: Dr. Carlean Purl (2015) [name], MD  The patient has a past medical history of diverticulitis, colonic polyps with adenoma. He comes to the ER with complaints of abdominal fullness and pain to the Left lower quadrant since this past Wednesday (5 days ago) and it has been worsening. He denies having vomiting but has been nauseous. No diarrhea, fevers, back pain,  diaphoresis headache, weakness (general or focal), confusion, change of vision,  dysphagia, aphagia, shortness of breath,  vomiting, diarrhea, lower extremity swelling, rash, neck pain, chest pain   Past Medical History  Diagnosis Date  . Fatty liver   . Diverticulitis   . Personal history of colonic polyp - adenoma 07/23/2013   Past Surgical History  Procedure Laterality Date  . Colonoscopy w/ biopsies  07/23/2013   Family History  Problem Relation Age of Onset  . Diabetes Maternal Aunt     x2   Social History  Substance Use Topics  . Smoking status: Never Smoker   . Smokeless tobacco: Never Used  . Alcohol Use: Yes     Comment: RARELY    Review of Systems  Review of Systems All other systems negative except as documented in the HPI. All pertinent positives and negatives as reviewed in the HPI.   Allergies  Codeine  Home Medications   Prior to Admission medications   Medication Sig Start Date End Date Taking? Authorizing Provider  aspirin EC 325 MG tablet Take 650 mg by mouth daily as needed (headache).   Yes Historical Provider, MD  FIBER PO Take 1 capsule by mouth 3 (three) times daily.   Yes Historical Provider, MD  MELOXICAM PO Take 1 tablet by mouth daily as needed (pain).    Yes Historical Provider, MD   ciprofloxacin (CIPRO) 500 MG tablet Take 1 tablet (500 mg total) by mouth 2 (two) times daily. 03/10/15   [name], PA-C  HYDROcodone-acetaminophen (NORCO/VICODIN) 5-325 MG per tablet Take 2 tablets by mouth every 4 (four) hours as needed. Patient not taking: Reported on 03/09/2015 04/01/13   [name], MD  ibuprofen (ADVIL,MOTRIN) 600 MG tablet Take 1 tablet (600 mg total) by mouth every 6 (six) hours as needed. Patient not taking: Reported on 03/09/2015 09/04/14   [name], PA-C  metroNIDAZOLE (FLAGYL) 500 MG tablet Take 1 tablet (500 mg total) by mouth 2 (two) times daily. 03/10/15   Haileigh Pitz Carlota Raspberry, PA-C  ondansetron (ZOFRAN) 4 MG tablet Take 1 tablet (4 mg total) by mouth every 6 (six) hours. 03/10/15   [name], PA-C  oxyCODONE-acetaminophen (PERCOCET/ROXICET) 5-325 MG tablet Take 1 tablet by mouth every 6 (six) hours as needed. 03/10/15   Cadence Haslam Carlota Raspberry, PA-C   BP 139/87 mmHg  Pulse 85  Temp(Src) 98.1 F (36.7 C) (Oral)  Resp 16  Ht 5\' 3"  (1.6 m)  Wt 79.833 kg  BMI 31.18 kg/m2  SpO2 98% Physical Exam  Constitutional: He appears well-developed and well-nourished. No distress.  HENT:  Head: Normocephalic and atraumatic.  Right Ear: Tympanic membrane and ear canal normal.  Left Ear: Tympanic membrane and ear canal normal.  Nose: Nose normal.  Mouth/Throat: Uvula is midline, oropharynx is clear and moist and mucous membranes are normal.  Eyes: Pupils are  equal, round, and reactive to light.  Neck: Normal range of motion. Neck supple.  Cardiovascular: Normal rate and regular rhythm.   Pulmonary/Chest: Effort normal.  Abdominal: Soft. Bowel sounds are normal. He exhibits no distension. There is no hepatosplenomegaly. There is tenderness in the left lower quadrant. There is guarding (voluntary). There is no rebound and no CVA tenderness.  abd is soft  Musculoskeletal:  No LE swelling  Neurological: He is alert.  Acting at baseline  Skin: Skin is warm and dry. No  rash noted.  Nursing note and vitals reviewed.   ED Course  Procedures (including critical care time) Labs Review Labs Reviewed  COMPREHENSIVE METABOLIC PANEL - Abnormal; Notable for the following:    Glucose, Bld 272 (*)    All other components within normal limits  URINALYSIS, ROUTINE W REFLEX MICROSCOPIC (NOT AT [facility]) - Abnormal; Notable for the following:    Specific Gravity, Urine 1.037 (*)    Glucose, UA >1000 (*)    Ketones, ur 15 (*)    All other components within normal limits  URINE MICROSCOPIC-ADD ON - Abnormal; Notable for the following:    Squamous Epithelial / LPF 0-5 (*)    All other components within normal limits  LIPASE, BLOOD  CBC    Imaging Review Ct Abdomen Pelvis W Contrast  03/09/2015  CLINICAL DATA:  50 year old male with left-sided abdominal pain and nausea. EXAM: CT ABDOMEN AND PELVIS WITH CONTRAST TECHNIQUE: Multidetector CT imaging of the abdomen and pelvis was performed using the standard protocol following bolus administration of intravenous contrast. CONTRAST:  151mL OMNIPAQUE IOHEXOL 300 MG/ML  SOLN COMPARISON:  CT dated 04/01/2013 FINDINGS: The visualized lung bases are clear. No intra-abdominal free air or free fluid. The liver, gallbladder, pancreas, spleen, adrenal glands, kidneys, visualized ureters, and urinary bladder appear unremarkable. The prostate and seminal vesicles are grossly unremarkable. There is extensive sigmoid and colonic diverticulosis. Minimal stranding surrounding the sigmoid colon as well as along the descending colon may represent chronic inflammation and scarring. A mild acute diverticulitis is not excluded. Clinical correlation is recommended. No evidence of bowel obstruction. Normal appendix. The abdominal aorta and IVC appear unremarkable. No portal venous gas identified. There is no adenopathy. The abdominal wall appear unremarkable. Bilateral L5 pars defects. No acute fracture. IMPRESSION: Diverticulosis. Minimal pericolonic  stranding likely represent chronic inflammation/scarring. Mild acute diverticulitis is not excluded. Clinical correlation is recommended. No drainable fluid collection/abscess. No bowel obstruction.  Normal appendix. Electronically Signed   By: Anner Crete M.D.   On: 03/09/2015 23:58   I have personally reviewed and evaluated these images and lab results as part of my medical decision-making.   EKG Interpretation None      MDM   Final diagnoses:  Diverticulitis of intestine without perforation or abscess without bleeding    Pt with mild diverticulitis vs chronic scarring. Clinically he has diverticulitis and I will treat with Cipro/Flagyl. Rx Zofran and pain medications as well. Referral back to PCP and Dr. Carlean Purl.  Pt has not had any fevers, V/D, his lab work is reassuring and he should do well as an outpatient. Strict return to ED precautions given.  Filed Vitals:   03/09/15 2300 03/09/15 2330  BP: 139/87   Pulse: 85 85  Temp:    Resp: [id number] PA-C 03/10/15 0030  Gareth Morgan, MD 03/10/15 1451

## 2015-03-10 MED ORDER — METRONIDAZOLE 500 MG PO TABS: 500.0000 mg | ORAL_TABLET | Freq: Two times a day (BID) | ORAL | Status: DC

## 2015-03-10 MED ORDER — METRONIDAZOLE 500 MG PO TABS
500.0000 mg | ORAL_TABLET | Freq: Once | ORAL | Status: AC
  Administered 2015-03-10: 500 mg via ORAL
  Filled 2015-03-10: qty 1

## 2015-03-10 MED ORDER — ONDANSETRON HCL 4 MG PO TABS: 4.0000 mg | ORAL_TABLET | Freq: Four times a day (QID) | ORAL | Status: DC

## 2015-03-10 MED ORDER — OXYCODONE-ACETAMINOPHEN 5-325 MG PO TABS
1.0000 | ORAL_TABLET | Freq: Four times a day (QID) | ORAL | Status: DC | PRN
Start: 2015-03-10 — End: 2017-05-26

## 2015-03-10 MED ORDER — CIPROFLOXACIN HCL 500 MG PO TABS: 500.0000 mg | ORAL_TABLET | Freq: Two times a day (BID) | ORAL | Status: DC

## 2015-03-10 MED ORDER — CIPROFLOXACIN HCL 500 MG PO TABS
500.0000 mg | ORAL_TABLET | Freq: Once | ORAL | Status: AC
  Administered 2015-03-10: 500 mg via ORAL
  Filled 2015-03-10: qty 1

## 2015-03-10 NOTE — Discharge Instructions (Signed)
Diverticulitis °Diverticulitis is inflammation or infection of small pouches in your colon that form when you have a condition called diverticulosis. The pouches in your colon are called diverticula. Your colon, or large intestine, is where water is absorbed and stool is formed. °Complications of diverticulitis can include: °· Bleeding. °· Severe infection. °· Severe pain. °· Perforation of your colon. °· Obstruction of your colon. °CAUSES  °Diverticulitis is caused by bacteria. °Diverticulitis happens when stool becomes trapped in diverticula. This allows bacteria to grow in the diverticula, which can lead to inflammation and infection. °RISK FACTORS °People with diverticulosis are at risk for diverticulitis. Eating a diet that does not include enough fiber from fruits and vegetables may make diverticulitis more likely to develop. °SYMPTOMS  °Symptoms of diverticulitis may include: °· Abdominal pain and tenderness. The pain is normally located on the left side of the abdomen, but may occur in other areas. °· Fever and chills. °· Bloating. °· Cramping. °· Nausea. °· Vomiting. °· Constipation. °· Diarrhea. °· Blood in your stool. °DIAGNOSIS  °Your health care provider will ask you about your medical history and do a physical exam. You may need to have tests done because many medical conditions can cause the same symptoms as diverticulitis. Tests may include: °· Blood tests. °· Urine tests. °· Imaging tests of the abdomen, including X-rays and CT scans. °When your condition is under control, your health care provider may recommend that you have a colonoscopy. A colonoscopy can show how severe your diverticula are and whether something else is causing your symptoms. °TREATMENT  °Most cases of diverticulitis are mild and can be treated at home. Treatment may include: °· Taking over-the-counter pain medicines. °· Following a clear liquid diet. °· Taking antibiotic medicines by mouth for 7-10 days. °More severe cases may  be treated at a hospital. Treatment may include: °· Not eating or drinking. °· Taking prescription pain medicine. °· Receiving antibiotic medicines through an IV tube. °· Receiving fluids and nutrition through an IV tube. °· Surgery. °HOME CARE INSTRUCTIONS  °· Follow your health care provider's instructions carefully. °· Follow a full liquid diet or other diet as directed by your health care provider. After your symptoms improve, your health care provider may tell you to change your diet. He or she may recommend you eat a high-fiber diet. Fruits and vegetables are good sources of fiber. Fiber makes it easier to pass stool. °· Take fiber supplements or probiotics as directed by your health care provider. °· Only take medicines as directed by your health care provider. °· Keep all your follow-up appointments. °SEEK MEDICAL CARE IF:  °· Your pain does not improve. °· You have a hard time eating food. °· Your bowel movements do not return to normal. °SEEK IMMEDIATE MEDICAL CARE IF:  °· Your pain becomes worse. °· Your symptoms do not get better. °· Your symptoms suddenly get worse. °· You have a fever. °· You have repeated vomiting. °· You have bloody or black, tarry stools. °MAKE SURE YOU:  °· Understand these instructions. °· Will watch your condition. °· Will get help right away if you are not doing well or get worse. °  °This information is not intended to replace advice given to you by your health care provider. Make sure you discuss any questions you have with your health care provider. °  °Document Released: 11/18/2004 Document Revised: 02/13/2013 Document Reviewed: 01/03/2013 °Elsevier Interactive Patient Education ©2016 [employer] ° °

## 2015-05-15 ENCOUNTER — Emergency Department (HOSPITAL_COMMUNITY)
Admission: EM | Admit: 2015-05-15 | Discharge: 2015-05-16 | Disposition: A | Discharge status: Home / Self Care | Payer: Medicaid Other | Attending: Emergency Medicine | Admitting: Emergency Medicine

## 2015-05-15 ENCOUNTER — Encounter (HOSPITAL_COMMUNITY): Payer: Self-pay | Admitting: Emergency Medicine

## 2015-05-15 DIAGNOSIS — R350 Frequency of micturition: Secondary | ICD-10-CM | POA: Insufficient documentation

## 2015-05-15 DIAGNOSIS — Z79899 Other long term (current) drug therapy: Secondary | ICD-10-CM | POA: Diagnosis not present

## 2015-05-15 DIAGNOSIS — Z86010 Personal history of colonic polyps: Secondary | ICD-10-CM | POA: Insufficient documentation

## 2015-05-15 DIAGNOSIS — Z792 Long term (current) use of antibiotics: Secondary | ICD-10-CM | POA: Diagnosis not present

## 2015-05-15 DIAGNOSIS — K5900 Constipation, unspecified: Secondary | ICD-10-CM | POA: Diagnosis not present

## 2015-05-15 DIAGNOSIS — R1032 Left lower quadrant pain: Secondary | ICD-10-CM | POA: Diagnosis present

## 2015-05-15 DIAGNOSIS — Z7982 Long term (current) use of aspirin: Secondary | ICD-10-CM | POA: Diagnosis not present

## 2015-05-15 DIAGNOSIS — R52 Pain, unspecified: Secondary | ICD-10-CM

## 2015-05-15 LAB — COMPREHENSIVE METABOLIC PANEL
ALBUMIN: 4.2 g/dL (ref 3.5–5.0)
ALK PHOS: 95 U/L (ref 38–126)
ALT: 26 U/L (ref 17–63)
AST: 25 U/L (ref 15–41)
Anion gap: 9 (ref 5–15)
BILIRUBIN TOTAL: 0.6 mg/dL (ref 0.3–1.2)
BUN: 9 mg/dL (ref 6–20)
CALCIUM: 9.5 mg/dL (ref 8.9–10.3)
CO2: 30 mmol/L (ref 22–32)
Chloride: 100 mmol/L — ABNORMAL LOW (ref 101–111)
Creatinine, Ser: 0.69 mg/dL (ref 0.61–1.24)
GFR calc Af Amer: >60 mL/min (ref >60)
GFR calc non Af Amer: >60 mL/min (ref >60)
Glucose, Bld: 287 mg/dL — ABNORMAL HIGH (ref 65–99)
Potassium: 4.3 mmol/L (ref 3.5–5.1)
Sodium: 139 mmol/L (ref 135–145)
TOTAL PROTEIN: 7 g/dL (ref 6.5–8.1)

## 2015-05-15 LAB — URINALYSIS, ROUTINE W REFLEX MICROSCOPIC
BILIRUBIN URINE: NEGATIVE
Glucose, UA: >1000 mg/dL — AB
HGB URINE DIPSTICK: NEGATIVE
Ketones, ur: NEGATIVE mg/dL
Leukocytes, UA: NEGATIVE
NITRITE: NEGATIVE
Protein, ur: NEGATIVE mg/dL
Specific Gravity, Urine: 1.024 (ref 1.005–1.030)
pH: 6.5 (ref 5.0–8.0)

## 2015-05-15 LAB — URINE MICROSCOPIC-ADD ON: Bacteria, UA: NONE SEEN

## 2015-05-15 LAB — CBC
HCT: 47.7 % (ref 39.0–52.0)
Hemoglobin: 15.9 g/dL (ref 13.0–17.0)
MCH: 29.4 pg (ref 26.0–34.0)
MCHC: 33.3 g/dL (ref 30.0–36.0)
MCV: 88.2 fL (ref 78.0–100.0)
Platelets: 272 10*3/uL (ref 150–400)
RBC: 5.41 MIL/uL (ref 4.22–5.81)
RDW: 12.7 % (ref 11.5–15.5)
WBC: 7.8 10*3/uL (ref 4.0–10.5)

## 2015-05-15 LAB — LIPASE, BLOOD: Lipase: 35 U/L (ref 11–51)

## 2015-05-15 MED ORDER — KETOROLAC TROMETHAMINE 60 MG/2ML IM SOLN
60.0000 mg | Freq: Once | INTRAMUSCULAR | Status: AC
  Administered 2015-05-15: 60 mg via INTRAMUSCULAR
  Filled 2015-05-15: qty 2

## 2015-05-15 NOTE — ED Notes (Signed)
MD at bedside. 

## 2015-05-15 NOTE — ED Notes (Signed)
Pt states he has left sided abdominal pain that stretches down to his groin. Pt denies urinary symptoms. Pt states this pain feels just like his diverticulitis pain, like pressure. Pt states he has been very stressed lately and has also not been taking his fiber supplements as he normally does. Pt's last bowel movement was this morning. Pt denies nausea and vomiting

## 2015-05-15 NOTE — ED Provider Notes (Signed)
CSN: [id number]     Arrival date & time 05/15/15  2144 History   By signing my name below, I, Marcus Mason, attest that this documentation has been prepared under the direction and in the presence of [name], MD.  Electronically Signed: Forrestine Mason, ED Scribe. 05/15/2015. 11:45 PM.   Chief Complaint  Patient presents with  . Abdominal Pain   Patient is a 50 y.o. male presenting with abdominal pain. The history is provided by the patient. No language interpreter was used.  Abdominal Pain Pain location:  LUQ and LLQ Pain quality: not burning   Pain radiates to:  Does not radiate Pain severity:  Moderate Duration:  3 days Timing:  Constant Progression:  Unchanged Chronicity:  New Relieved by:  None tried Worsened by:  Nothing tried Ineffective treatments:  None tried Associated symptoms: no chest pain, no chills, no fever, no nausea, no shortness of breath and no vomiting   Risk factors: no alcohol abuse     HPI Comments: Marcus Mason is a 50 y.o. male with a PMHx of diverticulitis who presents to the Emergency Department complaining of constant, ongoing L sided abdominal pain x 2-3 days. Pt states "it feels like my stomach and stretching". No aggravating or alleviating factors. Ppt also reports ongoing urinary frequency. No OTC medications or home remedies attempted prior to arrival. No recent fever, chills, nausea, vomiting, diarrhea, constipation, dysuria, hematuria. No difficulty starting or stopping urine stream.  PCP: [name], MD    Past Medical History  Diagnosis Date  . Fatty liver   . Diverticulitis   . Personal history of colonic polyp - adenoma 07/23/2013   Past Surgical History  Procedure Laterality Date  . Colonoscopy w/ biopsies  07/23/2013   Family History  Problem Relation Age of Onset  . Diabetes Maternal Aunt     x2   Social History  Substance Use Topics  . Smoking status: Never Smoker   . Smokeless tobacco: Never Used  . Alcohol  Use: Yes     Comment: RARELY    Review of Systems  Constitutional: Negative for fever and chills.  Respiratory: Negative for shortness of breath.   Cardiovascular: Negative for chest pain.  Gastrointestinal: Positive for abdominal pain. Negative for nausea and vomiting.  Genitourinary: Positive for frequency.  Neurological: Negative for headaches.  Psychiatric/Behavioral: Negative for confusion.  All other systems reviewed and are negative.     Allergies  Codeine  Home Medications   Prior to Admission medications   Medication Sig Start Date End Date Taking? Authorizing Provider  aspirin EC 325 MG tablet Take 650 mg by mouth daily as needed (headache).    Historical Provider, MD  ciprofloxacin (CIPRO) 500 MG tablet Take 1 tablet (500 mg total) by mouth 2 (two) times daily. 03/10/15   [name], PA-C  FIBER PO Take 1 capsule by mouth 3 (three) times daily.    Historical Provider, MD  HYDROcodone-acetaminophen (NORCO/VICODIN) 5-325 MG per tablet Take 2 tablets by mouth every 4 (four) hours as needed. Patient not taking: Reported on 03/09/2015 04/01/13   [name], MD  ibuprofen (ADVIL,MOTRIN) 600 MG tablet Take 1 tablet (600 mg total) by mouth every 6 (six) hours as needed. Patient not taking: Reported on 03/09/2015 09/04/14   [name], PA-C  MELOXICAM PO Take 1 tablet by mouth daily as needed (pain).     Historical Provider, MD  metroNIDAZOLE (FLAGYL) 500 MG tablet Take 1 tablet (500 mg total) by mouth 2 (two)  times daily. 03/10/15   [name], PA-C  ondansetron (ZOFRAN) 4 MG tablet Take 1 tablet (4 mg total) by mouth every 6 (six) hours. 03/10/15   [name], PA-C  oxyCODONE-acetaminophen (PERCOCET/ROXICET) 5-325 MG tablet Take 1 tablet by mouth every 6 (six) hours as needed. 03/10/15   [name], PA-C   Triage Vitals: BP 137/84 mmHg  Pulse 88  Temp(Src) 97.8 F (36.6 C) (Oral)  Resp 16  Ht 5\' 5"  (1.651 m)  Wt 176 lb (79.833 kg)  BMI 29.29 kg/m2  SpO2  100%   Physical Exam  Constitutional: He is oriented to person, place, and time. He appears well-developed and well-nourished.  HENT:  Head: Normocephalic and atraumatic.  Mouth/Throat: Oropharynx is clear and moist.  Eyes: EOM are normal. Pupils are equal, round, and reactive to light.  Neck: Normal range of motion.  No carotid bruits   Cardiovascular: Normal rate, regular rhythm, normal heart sounds and intact distal pulses.   Pulmonary/Chest: Effort normal and breath sounds normal. No stridor. No respiratory distress. He has no wheezes. He has no rales.  Abdominal: Soft. He exhibits no distension. There is no tenderness. There is no rebound and no guarding.  Hyperactive bowel sounds   Musculoskeletal: Normal range of motion.  Neurological: He is alert and oriented to person, place, and time. He has normal reflexes.  Skin: Skin is warm and dry.  Psychiatric: He has a normal mood and affect. Judgment normal.  Nursing note and vitals reviewed.   ED Course  Procedures (including critical care time)  DIAGNOSTIC STUDIES: Oxygen Saturation is 100% on RA, Normal by my interpretation.    COORDINATION OF CARE: 11:39 PM- Will order Lipase, CMP, CBC, and urinalysis. Discussed treatment plan with pt at bedside and pt agreed to plan.     Labs Review Labs Reviewed  COMPREHENSIVE METABOLIC PANEL - Abnormal; Notable for the following:    Chloride 100 (*)    Glucose, Bld 287 (*)    All other components within normal limits  LIPASE, BLOOD  CBC  URINALYSIS, ROUTINE W REFLEX MICROSCOPIC (NOT AT [facility])    Imaging Review No results found. I have personally reviewed and evaluated these images and lab results as part of my medical decision-making.   EKG Interpretation None      MDM   Final diagnoses:  None    Constipation no diverticulitis on CT, well appearing will treat with levsin and miralax follow up with your PMD for recheck this week.    I personally performed the services  described in this documentation, which was scribed in my presence. The recorded information has been reviewed and is accurate.      [name], MD 05/16/15 [phone]

## 2015-05-16 ENCOUNTER — Emergency Department (HOSPITAL_COMMUNITY): Payer: Medicaid Other

## 2015-05-16 ENCOUNTER — Encounter (HOSPITAL_COMMUNITY): Payer: Self-pay | Admitting: Emergency Medicine

## 2015-05-16 MED ORDER — POLYETHYLENE GLYCOL 3350 17 G PO PACK: 17.0000 g | PACK | Freq: Every day | ORAL | Status: DC

## 2015-05-16 MED ORDER — HYOSCYAMINE SULFATE 0.125 MG SL SUBL: 0.1250 mg | SUBLINGUAL_TABLET | SUBLINGUAL | Status: DC | PRN

## 2015-05-16 NOTE — ED Notes (Signed)
Patient transported to CT 

## 2015-05-16 NOTE — Discharge Instructions (Signed)

## 2015-06-25 DIAGNOSIS — R1032 Left lower quadrant pain: Secondary | ICD-10-CM | POA: Diagnosis present

## 2015-06-25 DIAGNOSIS — R42 Dizziness and giddiness: Secondary | ICD-10-CM | POA: Insufficient documentation

## 2015-06-25 DIAGNOSIS — R110 Nausea: Secondary | ICD-10-CM | POA: Diagnosis not present

## 2015-06-25 DIAGNOSIS — K5900 Constipation, unspecified: Secondary | ICD-10-CM | POA: Diagnosis not present

## 2015-06-25 DIAGNOSIS — Z7982 Long term (current) use of aspirin: Secondary | ICD-10-CM | POA: Insufficient documentation

## 2015-06-25 DIAGNOSIS — Z86010 Personal history of colonic polyps: Secondary | ICD-10-CM | POA: Diagnosis not present

## 2015-06-25 DIAGNOSIS — R509 Fever, unspecified: Secondary | ICD-10-CM | POA: Diagnosis not present

## 2015-06-25 DIAGNOSIS — Z86018 Personal history of other benign neoplasm: Secondary | ICD-10-CM | POA: Insufficient documentation

## 2015-06-25 DIAGNOSIS — R1012 Left upper quadrant pain: Secondary | ICD-10-CM | POA: Insufficient documentation

## 2015-06-26 ENCOUNTER — Emergency Department (HOSPITAL_COMMUNITY)
Admission: EM | Admit: 2015-06-26 | Discharge: 2015-06-26 | Disposition: A | Discharge status: Home / Self Care | Payer: Medicaid Other | Attending: Emergency Medicine | Admitting: Emergency Medicine

## 2015-06-26 ENCOUNTER — Encounter (HOSPITAL_COMMUNITY): Payer: Self-pay | Admitting: Emergency Medicine

## 2015-06-26 DIAGNOSIS — R1032 Left lower quadrant pain: Secondary | ICD-10-CM

## 2015-06-26 DIAGNOSIS — Z8719 Personal history of other diseases of the digestive system: Secondary | ICD-10-CM

## 2015-06-26 LAB — COMPREHENSIVE METABOLIC PANEL
ALK PHOS: 78 U/L (ref 38–126)
ALT: 20 U/L (ref 17–63)
AST: 24 U/L (ref 15–41)
Albumin: 4 g/dL (ref 3.5–5.0)
Anion gap: 11 (ref 5–15)
BILIRUBIN TOTAL: 1.1 mg/dL (ref 0.3–1.2)
BUN: 11 mg/dL (ref 6–20)
CO2: 25 mmol/L (ref 22–32)
CREATININE: 0.98 mg/dL (ref 0.61–1.24)
Calcium: 9.4 mg/dL (ref 8.9–10.3)
Chloride: 101 mmol/L (ref 101–111)
GFR calc Af Amer: >60 mL/min (ref >60)
GLUCOSE: 193 mg/dL — AB (ref 65–99)
Potassium: 3.8 mmol/L (ref 3.5–5.1)
Sodium: 137 mmol/L (ref 135–145)
TOTAL PROTEIN: 6.9 g/dL (ref 6.5–8.1)

## 2015-06-26 LAB — CBC
HCT: 48.8 % (ref 39.0–52.0)
Hemoglobin: 16.2 g/dL (ref 13.0–17.0)
MCH: 29.3 pg (ref 26.0–34.0)
MCHC: 33.2 g/dL (ref 30.0–36.0)
MCV: 88.4 fL (ref 78.0–100.0)
PLATELETS: 263 10*3/uL (ref 150–400)
RBC: 5.52 MIL/uL (ref 4.22–5.81)
RDW: 12.7 % (ref 11.5–15.5)
WBC: 16.4 10*3/uL — AB (ref 4.0–10.5)

## 2015-06-26 LAB — LIPASE, BLOOD: Lipase: 25 U/L (ref 11–51)

## 2015-06-26 LAB — URINALYSIS, ROUTINE W REFLEX MICROSCOPIC
BILIRUBIN URINE: NEGATIVE
GLUCOSE, UA: 250 mg/dL — AB
Hgb urine dipstick: NEGATIVE
KETONES UR: 15 mg/dL — AB
LEUKOCYTES UA: NEGATIVE
NITRITE: NEGATIVE
PROTEIN: NEGATIVE mg/dL
Specific Gravity, Urine: 1.022 (ref 1.005–1.030)
pH: 6 (ref 5.0–8.0)

## 2015-06-26 MED ORDER — METRONIDAZOLE 500 MG PO TABS: 500.0000 mg | ORAL_TABLET | Freq: Three times a day (TID) | ORAL | Status: DC

## 2015-06-26 MED ORDER — HYDROCODONE-ACETAMINOPHEN 5-325 MG PO TABS: 1.0000 | ORAL_TABLET | Freq: Four times a day (QID) | ORAL | Status: DC | PRN

## 2015-06-26 MED ORDER — CIPROFLOXACIN HCL 500 MG PO TABS: 500.0000 mg | ORAL_TABLET | Freq: Two times a day (BID) | ORAL | Status: DC

## 2015-06-26 NOTE — ED Notes (Addendum)
C/o LUQ pain and lower abd pain since 8am with mild nausea.  Last BM today but states he feels "a little" constipated.  Denies v/d.  Also reports burning with urination.  History of diverticulitis.

## 2015-06-26 NOTE — ED Provider Notes (Signed)
CSN: [id number]     Arrival date & time 06/25/15  2358 History  By signing my name below, I, Marcus Mason, attest that this documentation has been prepared under the direction and in the presence of Veryl Speak, MD. Electronically Signed: Altamease Mason, ED Scribe. 06/26/2015. 2:47 AM    Chief Complaint  Patient presents with  . Abdominal Pain   The history is provided by the patient. No language interpreter was used.   Marcus Mason is a 50 y.o. male with PMHx of diverticulitis who presents to the Emergency Department complaining of left upper and lower abdominal pain with onset approximately 19 hours ago. The pain is exacerbated by eating and feels similar to pain that he has had in the past with diverticulitis. Associated symptoms include lightheadedness, subjective fever, nausea, and constipation despite taking supplemental fiber. Pt denies hematochezia.   Past Medical History  Diagnosis Date  . Fatty liver   . Diverticulitis   . Personal history of colonic polyp - adenoma 07/23/2013   Past Surgical History  Procedure Laterality Date  . Colonoscopy w/ biopsies  07/23/2013   Family History  Problem Relation Age of Onset  . Diabetes Maternal Aunt     x2   Social History  Substance Use Topics  . Smoking status: Never Smoker   . Smokeless tobacco: Never Used  . Alcohol Use: Yes     Comment: RARELY    Review of Systems  10 Systems reviewed and all are negative for acute change except as noted in the HPI.  Allergies  Codeine  Home Medications   Prior to Admission medications   Medication Sig Start Date End Date Taking? Authorizing Provider  acetaminophen (TYLENOL) 500 MG tablet Take 1,000 mg by mouth every 6 (six) hours as needed for mild pain.    Historical Provider, MD  aspirin EC 325 MG tablet Take 650 mg by mouth daily as needed (headache).    Historical Provider, MD  ciprofloxacin (CIPRO) 500 MG tablet Take 1 tablet (500 mg total) by mouth 2 (two) times  daily. Patient not taking: Reported on 05/15/2015 03/10/15   [name], PA-C  FIBER PO Take 1 capsule by mouth 3 (three) times daily.    Historical Provider, MD  HYDROcodone-acetaminophen (NORCO/VICODIN) 5-325 MG per tablet Take 2 tablets by mouth every 4 (four) hours as needed. Patient not taking: Reported on 03/09/2015 04/01/13   [name], MD  hyoscyamine (LEVSIN/SL) 0.125 MG SL tablet Place 1 tablet (0.125 mg total) under the tongue every 4 (four) hours as needed. 05/16/15   April Palumbo, MD  ibuprofen (ADVIL,MOTRIN) 600 MG tablet Take 1 tablet (600 mg total) by mouth every 6 (six) hours as needed. Patient not taking: Reported on 03/09/2015 09/04/14   [name], PA-C  metroNIDAZOLE (FLAGYL) 500 MG tablet Take 1 tablet (500 mg total) by mouth 2 (two) times daily. Patient not taking: Reported on 05/15/2015 03/10/15   [name], PA-C  ondansetron (ZOFRAN) 4 MG tablet Take 1 tablet (4 mg total) by mouth every 6 (six) hours. Patient not taking: Reported on 05/15/2015 03/10/15   [name], PA-C  oxyCODONE-acetaminophen (PERCOCET/ROXICET) 5-325 MG tablet Take 1 tablet by mouth every 6 (six) hours as needed. Patient not taking: Reported on 05/15/2015 03/10/15   [name], PA-C  polyethylene glycol [facility]) packet Take 17 g by mouth daily. 05/16/15   April Palumbo, MD   BP 152/91 mmHg  Pulse 75  Temp(Src) 99 F (37.2 C) (Oral)  Resp 22  Ht 5\' 4"  (1.626 m)  Wt 175 lb (79.379 kg)  BMI 30.02 kg/m2  SpO2 100% Physical Exam  Constitutional: He is oriented to person, place, and time. He appears well-developed and well-nourished.  HENT:  Head: Normocephalic and atraumatic.  Eyes: EOM are normal.  Neck: Normal range of motion.  Cardiovascular: Normal rate, regular rhythm, normal heart sounds and intact distal pulses.   Pulmonary/Chest: Effort normal and breath sounds normal. No respiratory distress.  Abdominal: Soft. He exhibits no distension. There is tenderness in the  suprapubic area and left lower quadrant. There is no rebound and no guarding.  Musculoskeletal: Normal range of motion.  Neurological: He is alert and oriented to person, place, and time.  Skin: Skin is warm and dry.  Psychiatric: He has a normal mood and affect. Judgment normal.  Nursing note and vitals reviewed.   ED Course  Procedures (including critical care time) DIAGNOSTIC STUDIES: Oxygen Saturation is 100% on RA,  normal by my interpretation.    COORDINATION OF CARE: 2:47 AM Discussed treatment plan which includes lab work with pt at bedside and pt agreed to plan.  Labs Review Labs Reviewed  COMPREHENSIVE METABOLIC PANEL - Abnormal; Notable for the following:    Glucose, Bld 193 (*)    All other components within normal limits  CBC - Abnormal; Notable for the following:    WBC 16.4 (*)    All other components within normal limits  URINALYSIS, ROUTINE W REFLEX MICROSCOPIC (NOT AT [facility]) - Abnormal; Notable for the following:    Glucose, UA 250 (*)    Ketones, ur 15 (*)    All other components within normal limits  LIPASE, BLOOD    Imaging Review No results found. I have personally reviewed and evaluated these lab results as part of my medical decision-making.   EKG Interpretation None      MDM   Final diagnoses:  Left lower quadrant pain  History of diverticulitis    Patient with left lower quadrant pain, leukocytosis, and symptoms that are insistent with his prior episode of diverticulitis. He appears nontoxic and I believe appropriate for discharge with oral antibiotics, pain medication, and when necessary return.  I personally performed the services described in this documentation, which was scribed in my presence. The recorded information has been reviewed and is accurate.       Veryl Speak, MD 06/26/15 [phone]

## 2015-06-26 NOTE — Discharge Instructions (Signed)
Cipro and Flagyl as prescribed.  Hydrocodone as prescribed as needed for pain.  Return to the emergency department if you develop bloody stool, worsening pain, high fevers, or other new and concerning symptoms.

## 2015-11-24 ENCOUNTER — Encounter (HOSPITAL_COMMUNITY): Payer: Self-pay | Admitting: Emergency Medicine

## 2015-11-24 DIAGNOSIS — Z7982 Long term (current) use of aspirin: Secondary | ICD-10-CM | POA: Insufficient documentation

## 2015-11-24 DIAGNOSIS — R51 Headache: Secondary | ICD-10-CM | POA: Insufficient documentation

## 2015-11-24 DIAGNOSIS — Z79899 Other long term (current) drug therapy: Secondary | ICD-10-CM | POA: Diagnosis not present

## 2015-11-24 DIAGNOSIS — K5900 Constipation, unspecified: Secondary | ICD-10-CM | POA: Insufficient documentation

## 2015-11-24 DIAGNOSIS — R1030 Lower abdominal pain, unspecified: Secondary | ICD-10-CM | POA: Insufficient documentation

## 2015-11-24 LAB — CBC
HCT: 47.4 % (ref 39.0–52.0)
HEMOGLOBIN: 16 g/dL (ref 13.0–17.0)
MCH: 29.9 pg (ref 26.0–34.0)
MCHC: 33.8 g/dL (ref 30.0–36.0)
MCV: 88.4 fL (ref 78.0–100.0)
Platelets: 274 10*3/uL (ref 150–400)
RBC: 5.36 MIL/uL (ref 4.22–5.81)
RDW: 12.6 % (ref 11.5–15.5)
WBC: 8.2 10*3/uL (ref 4.0–10.5)

## 2015-11-24 LAB — COMPREHENSIVE METABOLIC PANEL
ALBUMIN: 4.2 g/dL (ref 3.5–5.0)
ALK PHOS: 80 U/L (ref 38–126)
ALT: 22 U/L (ref 17–63)
ANION GAP: 8 (ref 5–15)
AST: 21 U/L (ref 15–41)
BILIRUBIN TOTAL: 0.5 mg/dL (ref 0.3–1.2)
BUN: 10 mg/dL (ref 6–20)
CALCIUM: 9.2 mg/dL (ref 8.9–10.3)
CO2: 25 mmol/L (ref 22–32)
Chloride: 104 mmol/L (ref 101–111)
Creatinine, Ser: 0.72 mg/dL (ref 0.61–1.24)
GLUCOSE: 178 mg/dL — AB (ref 65–99)
Potassium: 3.6 mmol/L (ref 3.5–5.1)
Sodium: 137 mmol/L (ref 135–145)
TOTAL PROTEIN: 6.6 g/dL (ref 6.5–8.1)

## 2015-11-24 LAB — LIPASE, BLOOD: Lipase: 32 U/L (ref 11–51)

## 2015-11-24 LAB — URINALYSIS, ROUTINE W REFLEX MICROSCOPIC
BILIRUBIN URINE: NEGATIVE
GLUCOSE, UA: NEGATIVE mg/dL
HGB URINE DIPSTICK: NEGATIVE
Ketones, ur: NEGATIVE mg/dL
Leukocytes, UA: NEGATIVE
Nitrite: NEGATIVE
PH: 6 (ref 5.0–8.0)
Protein, ur: NEGATIVE mg/dL
SPECIFIC GRAVITY, URINE: 1.015 (ref 1.005–1.030)

## 2015-11-24 NOTE — ED Triage Notes (Signed)
Pt. reports left /lower abdominal pain with constipation onset 2 weeks ago , denies emesis or diarrhea , no fever or chills , pt. added episode of dizzy spell last week with right eye pressure .

## 2015-11-25 ENCOUNTER — Emergency Department (HOSPITAL_COMMUNITY): Payer: Medicaid Other

## 2015-11-25 ENCOUNTER — Emergency Department (HOSPITAL_COMMUNITY)
Admission: EM | Admit: 2015-11-25 | Discharge: 2015-11-25 | Disposition: A | Discharge status: Home / Self Care | Payer: Medicaid Other | Attending: Physician Assistant | Admitting: Physician Assistant

## 2015-11-25 DIAGNOSIS — R1030 Lower abdominal pain, unspecified: Secondary | ICD-10-CM

## 2015-11-25 DIAGNOSIS — R51 Headache: Secondary | ICD-10-CM

## 2015-11-25 DIAGNOSIS — R519 Headache, unspecified: Secondary | ICD-10-CM

## 2015-11-25 MED ORDER — POLYETHYLENE GLYCOL 3350 17 G PO PACK: PACK | ORAL | 0 refills | Status: DC

## 2015-11-25 NOTE — ED Provider Notes (Signed)
[facility] DEPT Provider Note   CSN: [id number] Arrival date & time: 11/24/15  2059     History   Chief Complaint Chief Complaint  Patient presents with  . Abdominal Pain    HPI Legend Marcus Mason is a 50 y.o. male.  Patient presents with complaint of abdominal discomfort in the left lower abdomen that started 2 weeks ago. No fever, hematochezia or melena, nausea/vomiting. He denies diarrhea but reports some constipation for which he takes a fiber supplement with limited relief. He reports history of diverticulitis in the past. The LLQ abdominal pain does not migrate or radiate. No urinary symptoms, testicular pain or scrotal swelling. He is also complaining of a pressure-like generalized headache with some right eye pressure as well x 1 week. No congestion, sore throat, sinus congestion. No alleviating or aggravating factors.    The history is provided by the patient. No language interpreter was used.  Abdominal Pain   This is a new problem. Associated symptoms include headaches. Pertinent negatives include fever, diarrhea, nausea and vomiting.    Past Medical History:  Diagnosis Date  . Diverticulitis   . Fatty liver   . Personal history of colonic polyp - adenoma 07/23/2013    Patient Active Problem List   Diagnosis Date Noted  . Diverticulitis of colon (without mention of hemorrhage)(562.11) 11/16/2013  . Personal history of colonic polyp - adenoma 07/23/2013  . Diverticulosis of colon (without mention of hemorrhage) 07/23/2013  . Hemorrhoids, internal, with bleeding 07/23/2013    Past Surgical History:  Procedure Laterality Date  . COLONOSCOPY W/ BIOPSIES  07/23/2013       Home Medications    Prior to Admission medications   Medication Sig Start Date End Date Taking? Authorizing Provider  acetaminophen (TYLENOL) 500 MG tablet Take 1,000 mg by mouth every 6 (six) hours as needed for mild pain.    Historical Provider, MD  aspirin EC 325 MG tablet Take 650 mg  by mouth daily as needed (headache).    Historical Provider, MD  ciprofloxacin (CIPRO) 500 MG tablet Take 1 tablet (500 mg total) by mouth 2 (two) times daily. 06/26/15   Veryl Speak, MD  FIBER PO Take 1 capsule by mouth 3 (three) times daily.    Historical Provider, MD  HYDROcodone-acetaminophen (NORCO) 5-325 MG tablet Take 1-2 tablets by mouth every 6 (six) hours as needed. 06/26/15   Veryl Speak, MD  hyoscyamine (LEVSIN/SL) 0.125 MG SL tablet Place 1 tablet (0.125 mg total) under the tongue every 4 (four) hours as needed. 05/16/15   April Palumbo, MD  ibuprofen (ADVIL,MOTRIN) 600 MG tablet Take 1 tablet (600 mg total) by mouth every 6 (six) hours as needed. Patient not taking: Reported on 03/09/2015 09/04/14   [name], PA-C  metroNIDAZOLE (FLAGYL) 500 MG tablet Take 1 tablet (500 mg total) by mouth 3 (three) times daily. 06/26/15   Veryl Speak, MD  ondansetron (ZOFRAN) 4 MG tablet Take 1 tablet (4 mg total) by mouth every 6 (six) hours. Patient not taking: Reported on 05/15/2015 03/10/15   [name], PA-C  oxyCODONE-acetaminophen (PERCOCET/ROXICET) 5-325 MG tablet Take 1 tablet by mouth every 6 (six) hours as needed. Patient not taking: Reported on 05/15/2015 03/10/15   [name], PA-C  polyethylene glycol [facility]) packet Take 17 g by mouth daily. 05/16/15   [name], MD    Family History Family History  Problem Relation Age of Onset  . Diabetes Maternal Aunt     x2    Social History  Social History  Substance Use Topics  . Smoking status: Never Smoker  . Smokeless tobacco: Never Used  . Alcohol use Yes     Comment: RARELY     Allergies   Codeine   Review of Systems Review of Systems  Constitutional: Negative for chills and fever.  Respiratory: Negative.  Negative for shortness of breath.   Cardiovascular: Negative.  Negative for chest pain.  Gastrointestinal: Positive for abdominal pain. Negative for diarrhea, nausea and vomiting.  Musculoskeletal:  Negative.   Skin: Negative.   Neurological: Positive for headaches.     Physical Exam Updated Vital Signs BP 138/98   Pulse 66   Temp 98.1 F (36.7 C) (Oral)   Resp 25   Ht 5\' 3"  (1.6 m)   Wt 79.4 kg   SpO2 99%   BMI 31.00 kg/m   Physical Exam  Constitutional: He is oriented to person, place, and time. He appears well-developed and well-nourished.  HENT:  Head: Normocephalic.  Mouth/Throat: Oropharynx is clear and moist.  Neck: Normal range of motion. Neck supple.  Cardiovascular: Normal rate and regular rhythm.   No murmur heard. Pulmonary/Chest: Effort normal and breath sounds normal. He has no wheezes. He has no rales.  Abdominal: Soft. Bowel sounds are normal. There is tenderness. There is no rebound and no guarding.    Musculoskeletal: Normal range of motion.  Neurological: He is alert and oriented to person, place, and time.  CN's 3-12 grossly intact. Coordination is without deficits. Speech clear, focused and organized. Ambulatory without ataxia.   Skin: Skin is warm and dry. No rash noted.  Psychiatric: He has a normal mood and affect.     ED Treatments / Results  Labs (all labs ordered are listed, but only abnormal results are displayed) Labs Reviewed  COMPREHENSIVE METABOLIC PANEL - Abnormal; Notable for the following:       Result Value   Glucose, Bld 178 (*)    All other components within normal limits  LIPASE, BLOOD  CBC  URINALYSIS, ROUTINE W REFLEX MICROSCOPIC (NOT AT [facility])    EKG  EKG Interpretation None       Radiology Dg Abdomen Acute W/chest  Result Date: 11/25/2015 CLINICAL DATA:  Left lower quadrant pain for 1 week EXAM: DG ABDOMEN ACUTE W/ 1V CHEST COMPARISON:  CT abdomen and pelvis 05/16/2015 FINDINGS: Normal heart size and pulmonary vascularity. No focal airspace disease or consolidation in the lungs. No blunting of costophrenic angles. No pneumothorax. Mediastinal contours appear intact. Scattered gas and stool in the colon. No  small or large bowel distention. No free intra-abdominal air. No abnormal air-fluid levels. No radiopaque stones. Visualized bones appear intact. IMPRESSION: No evidence of active pulmonary disease. Normal nonobstructive bowel gas pattern. Electronically Signed   By: Lucienne Capers M.D.   On: 11/25/2015 04:28    Procedures Procedures (including critical care time)  Medications Ordered in ED Medications - No data to display   Initial Impression / Assessment and Plan / ED Course  I have reviewed the triage vital signs and the nursing notes.  Pertinent labs & imaging results that were available during my care of the patient were reviewed by me and considered in my medical decision making (see chart for details).  Clinical Course    Patient with 2 weeks of LLQ abdominal discomfort. No diarrhea, vomiting or fever. History of diverticulitis, however, favor constipation after 2 weeks without other symptoms. Patient reports feeling constipated, taking supplements to correct. He is well appearing and  comfortable. Will Rx Miralax and recommend outpatient follow up.  He has 1 week of pressure type headache. Normal neurologic exam without deficit to cause concern for intracranial abnormality.   Final Clinical Impressions(s) / ED Diagnoses   Final diagnoses:  None  1. Abdominal pain 2. Constipation 3. Nonspecific headache  New Prescriptions New Prescriptions   No medications on file     Charlann Lange, PA-C 11/25/15 [place], MD 11/25/15 [phone]

## 2016-08-08 IMAGING — CT CT ABD-PELV W/ CM
2 of 5 series · 11 of 46 positions shown, 12 images · IV contrast (Iodine)
Comparison: CT dated 04/01/2013

CLINICAL DATA: 49-year-old male with left-sided abdominal pain and
nausea.

EXAM:
CT ABDOMEN AND PELVIS WITH CONTRAST
TECHNIQUE: Multidetector CT imaging of the abdomen and pelvis was performed
using the standard protocol following bolus administration of
intravenous contrast.
CONTRAST:  100mL OMNIPAQUE IOHEXOL 300 MG/ML  SOLN

[Series 201: routine, idose (2) · axial · 0.82mm/px · z∈[+18,+373]mm · 8 of 91 slices shown, 9 images]
[im 10/91  soft-tissue]
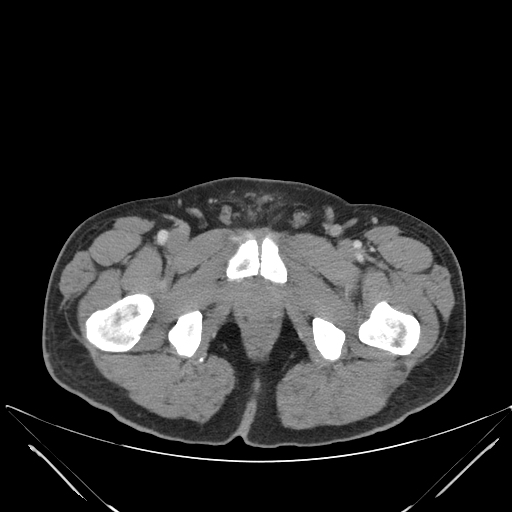
[im 10/91  bone]
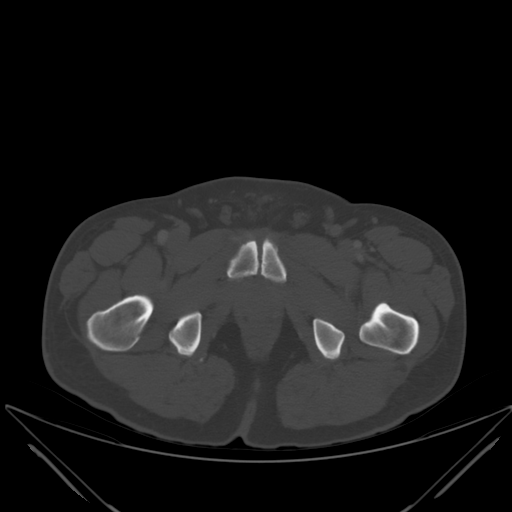
[im 19/91  soft-tissue]
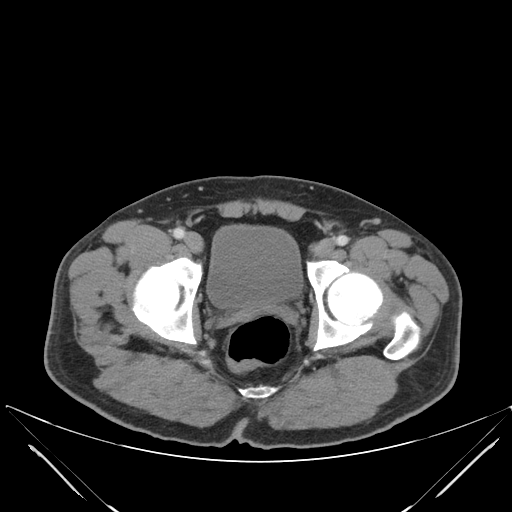
[im 29/91  soft-tissue]
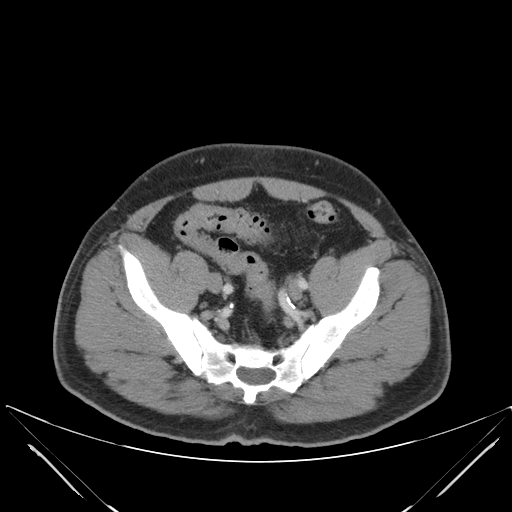
[im 38/91  soft-tissue]
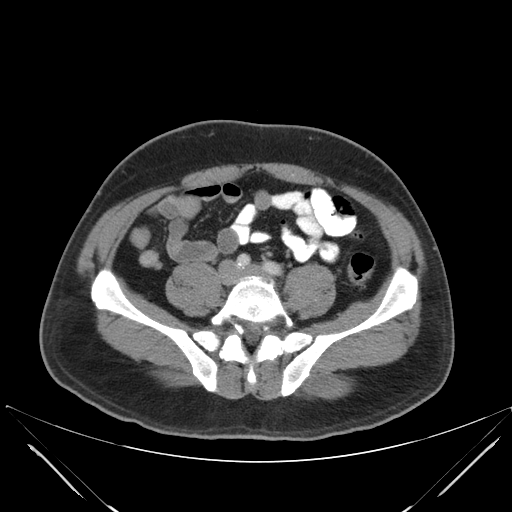
[im 53/91  soft-tissue]
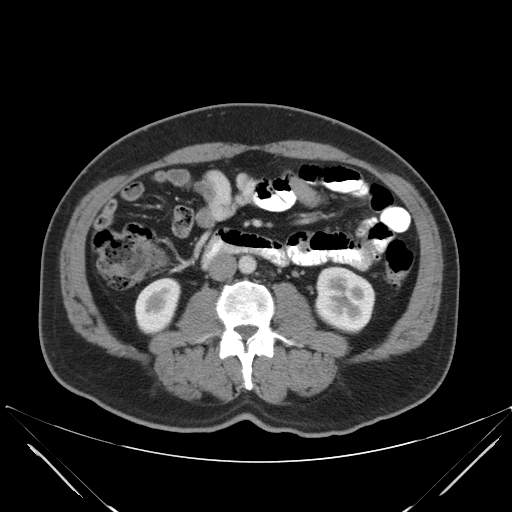
[im 62/91  soft-tissue]
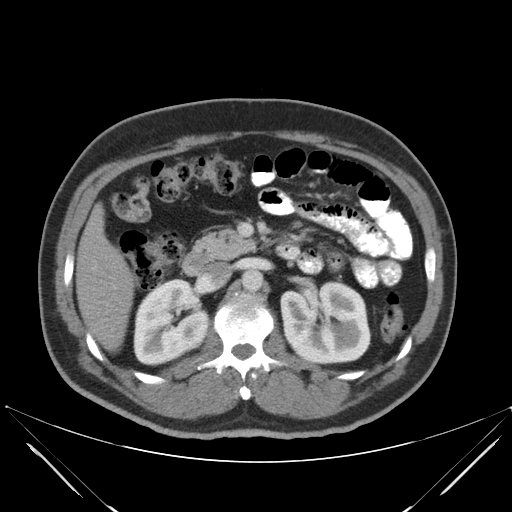
[im 72/91  soft-tissue]
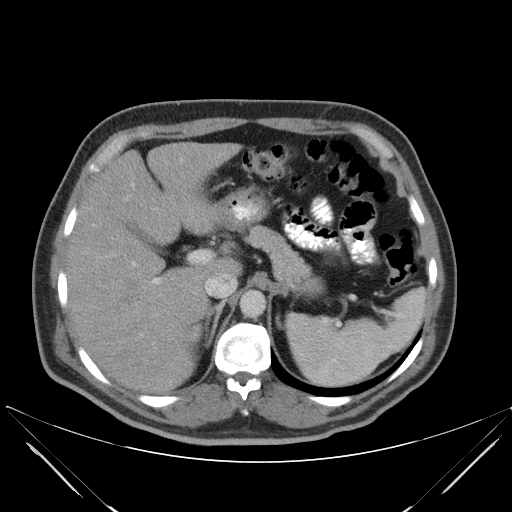
[im 81/91  soft-tissue]
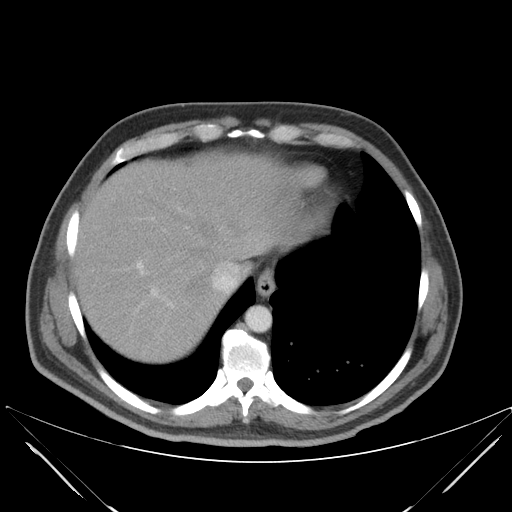

[Series 203: coronals, idose (2) · coronal · 0.45mm/px · 3 of 131 slices shown]
[im 44/131  soft-tissue]
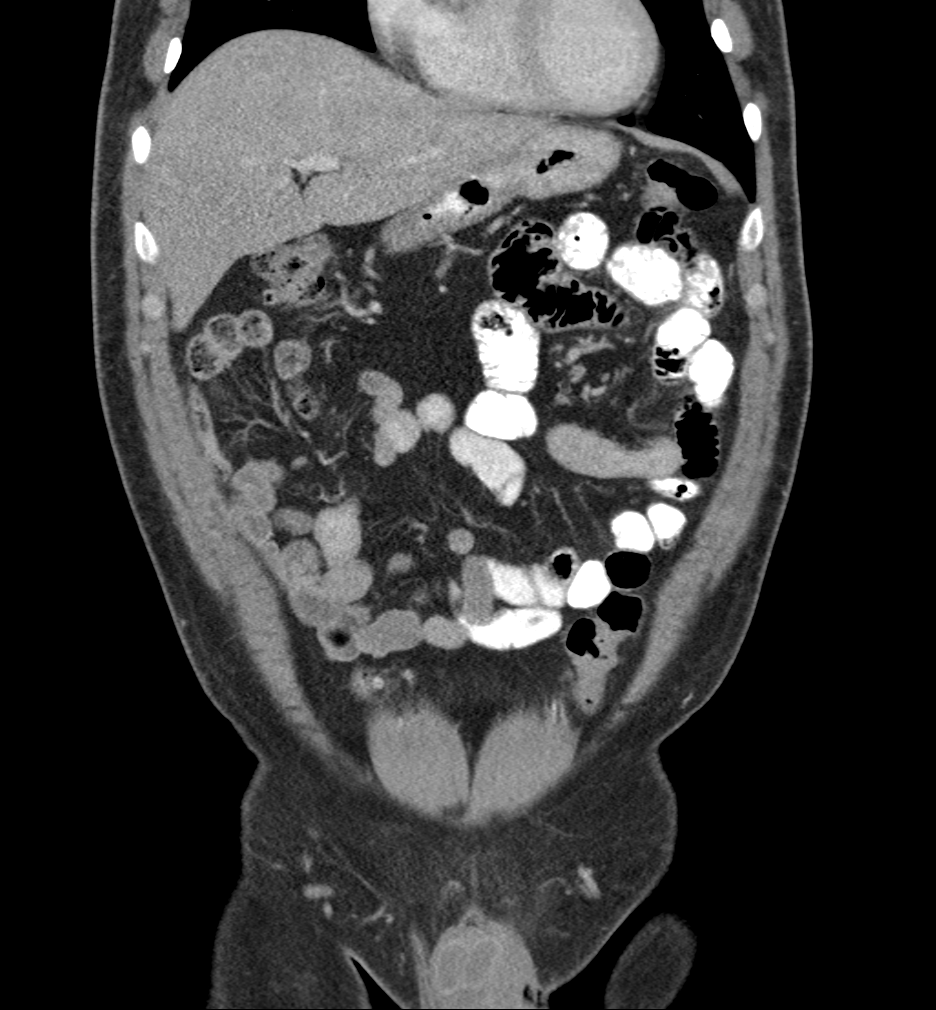
[im 58/131  soft-tissue]
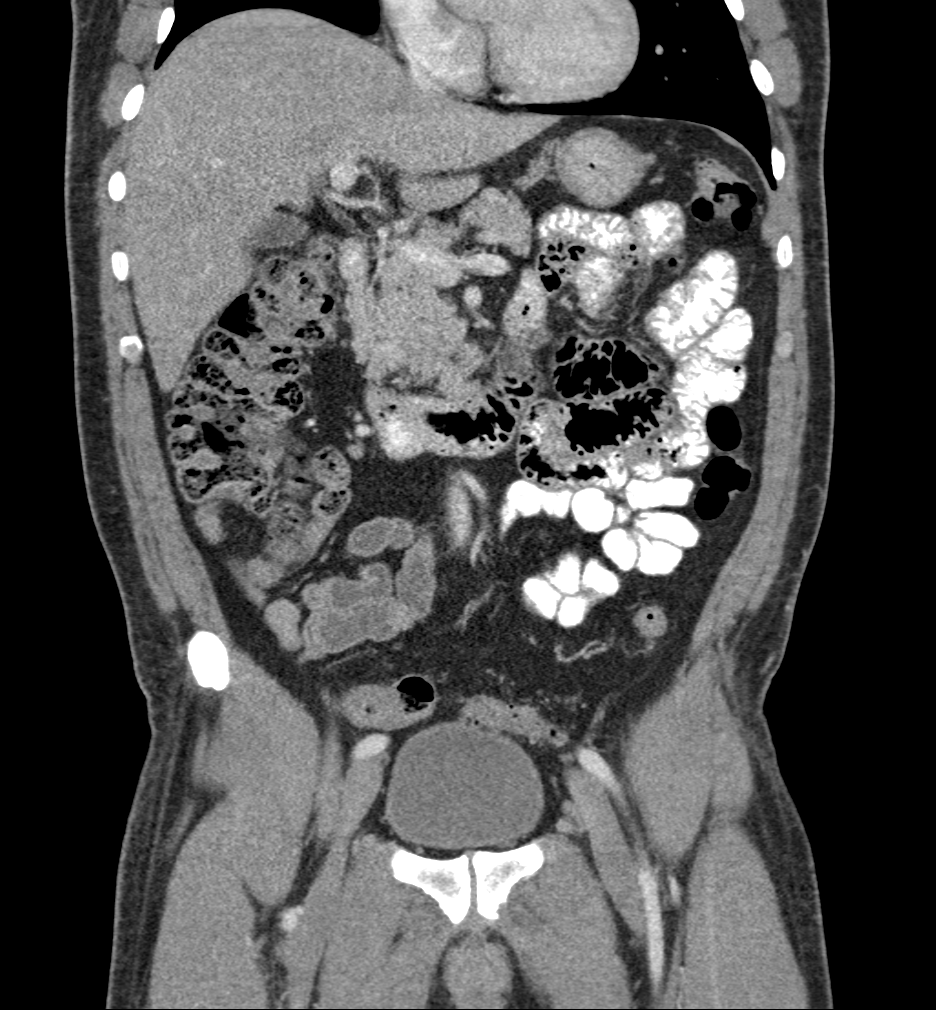
[im 73/131  soft-tissue]
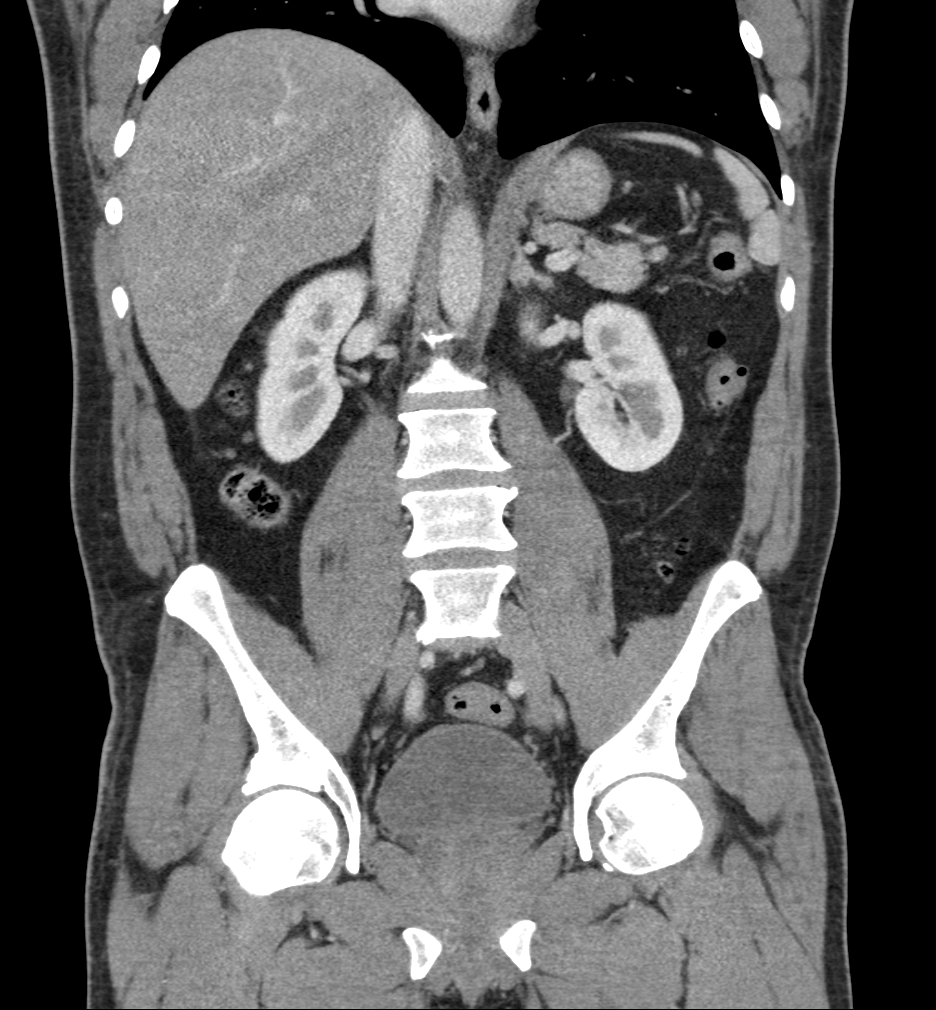

[11 of 46 positions shown; findings below may reference images not displayed]

FINDINGS: The visualized lung bases are clear. No intra-abdominal free air or
free fluid.

The liver, gallbladder, pancreas, spleen, adrenal glands, kidneys,
visualized ureters, and urinary bladder appear unremarkable. The
prostate and seminal vesicles are grossly unremarkable.

There is extensive sigmoid and colonic diverticulosis. Minimal
stranding surrounding the sigmoid colon as well as along the
descending colon may represent chronic inflammation and scarring. A
mild acute diverticulitis is not excluded. Clinical correlation is
recommended. No evidence of bowel obstruction. Normal appendix.

The abdominal aorta and IVC appear unremarkable. No portal venous
gas identified. There is no adenopathy. The abdominal wall appear
unremarkable. Bilateral L5 pars defects. No acute fracture.
IMPRESSION: Diverticulosis. Minimal pericolonic stranding likely represent
chronic inflammation/scarring. Mild acute diverticulitis is not
excluded. Clinical correlation is recommended. No drainable fluid
collection/abscess.

No bowel obstruction.  Normal appendix.

## 2016-08-21 ENCOUNTER — Encounter (HOSPITAL_COMMUNITY): Payer: Self-pay | Admitting: Emergency Medicine

## 2016-08-21 ENCOUNTER — Emergency Department (HOSPITAL_COMMUNITY): Payer: Medicaid Other

## 2016-08-21 ENCOUNTER — Emergency Department (HOSPITAL_COMMUNITY)
Admission: EM | Admit: 2016-08-21 | Discharge: 2016-08-21 | Disposition: A | Discharge status: Home / Self Care | Payer: Medicaid Other | Attending: Emergency Medicine | Admitting: Emergency Medicine

## 2016-08-21 DIAGNOSIS — R52 Pain, unspecified: Secondary | ICD-10-CM

## 2016-08-21 DIAGNOSIS — S92214A Nondisplaced fracture of cuboid bone of right foot, initial encounter for closed fracture: Secondary | ICD-10-CM | POA: Insufficient documentation

## 2016-08-21 DIAGNOSIS — Y998 Other external cause status: Secondary | ICD-10-CM | POA: Diagnosis not present

## 2016-08-21 DIAGNOSIS — Y929 Unspecified place or not applicable: Secondary | ICD-10-CM | POA: Insufficient documentation

## 2016-08-21 DIAGNOSIS — Y33XXXA Other specified events, undetermined intent, initial encounter: Secondary | ICD-10-CM | POA: Insufficient documentation

## 2016-08-21 DIAGNOSIS — M79671 Pain in right foot: Secondary | ICD-10-CM | POA: Diagnosis present

## 2016-08-21 DIAGNOSIS — Y9389 Activity, other specified: Secondary | ICD-10-CM | POA: Insufficient documentation

## 2016-08-21 MED ORDER — KETOROLAC TROMETHAMINE 60 MG/2ML IM SOLN
30.0000 mg | Freq: Once | INTRAMUSCULAR | Status: AC
  Administered 2016-08-21: 30 mg via INTRAMUSCULAR
  Filled 2016-08-21: qty 2

## 2016-08-21 MED ORDER — HYDROCODONE-ACETAMINOPHEN 5-325 MG PO TABS: ORAL_TABLET | ORAL | 0 refills | Status: DC

## 2016-08-21 NOTE — ED Notes (Signed)
Patient transported to X-ray 

## 2016-08-21 NOTE — ED Triage Notes (Signed)
Pt c/o right foot pain and swelling onset last x 1 week. Pt reports  while he was out playing with his children, when he stepped down he felt a pop in the bottom of his foot. Pt presents with a hard area at bottom of right foot. Pt reports pain radiates into calf.

## 2016-08-21 NOTE — ED Provider Notes (Signed)
[facility] Provider Note   CSN: [id number] Arrival date & time: 08/21/16  1856     History   Chief Complaint Chief Complaint  Patient presents with  . Foot Swelling   HPI  Blood pressure (!) 153/87, pulse 78, temperature 97.9 F (36.6 C), temperature source Oral, resp. rate 16, height 5\' 3"  (1.6 m), weight 77.1 kg (170 lb), SpO2 99 %.  Valton Schwartz Broadwell is a 51 y.o. male with no significant past medical history complaining of right foot pain worse in the a.m. starting 1 week ago significantly worsened yesterday when he was playing with his children and pushed off against the sole of the right foot. He states that he is taken acetaminophen at home with no relief and that he is ambulating with a limp because of the severity of his pain.  Past Medical History:  Diagnosis Date  . Diverticulitis   . Fatty liver   . Personal history of colonic polyp - adenoma 07/23/2013    Patient Active Problem List   Diagnosis Date Noted  . Diverticulitis of colon (without mention of hemorrhage)(562.11) 11/16/2013  . Personal history of colonic polyp - adenoma 07/23/2013  . Diverticulosis of colon (without mention of hemorrhage) 07/23/2013  . Hemorrhoids, internal, with bleeding 07/23/2013    Past Surgical History:  Procedure Laterality Date  . COLONOSCOPY W/ BIOPSIES  07/23/2013       Home Medications    Prior to Admission medications   Medication Sig Start Date End Date Taking? Authorizing Provider  acetaminophen (TYLENOL) 500 MG tablet Take 1,000 mg by mouth every 6 (six) hours as needed for mild pain.    [provider]  aspirin EC 325 MG tablet Take 650 mg by mouth daily as needed (headache).    [provider]  ciprofloxacin (CIPRO) 500 MG tablet Take 1 tablet (500 mg total) by mouth 2 (two) times daily. 06/26/15   Veryl Speak, MD  FIBER PO Take 1 capsule by mouth 3 (three) times daily.    [provider]  HYDROcodone-acetaminophen  (NORCO/VICODIN) 5-325 MG tablet Take 1-2 tablets by mouth every 6 hours as needed for pain. 08/21/16   Nuchem Grattan, Elmyra Ricks, PA-C  hyoscyamine (LEVSIN/SL) 0.125 MG SL tablet Place 1 tablet (0.125 mg total) under the tongue every 4 (four) hours as needed. 05/16/15   Palumbo, April, MD  ibuprofen (ADVIL,MOTRIN) 600 MG tablet Take 1 tablet (600 mg total) by mouth every 6 (six) hours as needed. Patient not taking: Reported on 03/09/2015 09/04/14   Jeannett Senior, PA-C  metroNIDAZOLE (FLAGYL) 500 MG tablet Take 1 tablet (500 mg total) by mouth 3 (three) times daily. 06/26/15   Veryl Speak, MD  ondansetron (ZOFRAN) 4 MG tablet Take 1 tablet (4 mg total) by mouth every 6 (six) hours. Patient not taking: Reported on 05/15/2015 03/10/15   [name], PA-C  oxyCODONE-acetaminophen (PERCOCET/ROXICET) 5-325 MG tablet Take 1 tablet by mouth every 6 (six) hours as needed. Patient not taking: Reported on 05/15/2015 03/10/15   [name], PA-C  polyethylene glycol [facility]) packet Take 17 g by mouth daily. 05/16/15   Palumbo, April, MD  polyethylene glycol [facility]) packet Take one pack up to three times daily until bowels move, maximum of 3 consecutive days 11/25/15   Charlann Lange, PA-C    Family History Family History  Problem Relation Age of Onset  . Diabetes Maternal Aunt        x2    Social History Social History  Substance Use Topics  .  Smoking status: Never Smoker  . Smokeless tobacco: Never Used  . Alcohol use Yes     Comment: RARELY     Allergies   Codeine   Review of Systems Review of Systems  A complete review of systems was obtained and all systems are negative except as noted in the HPI and PMH.    Physical Exam Updated Vital Signs BP 132/83   Pulse 78   Temp 97.9 F (36.6 C) (Oral)   Resp 16   Ht 5\' 3"  (1.6 m)   Wt 77.1 kg (170 lb)   SpO2 97%   BMI 30.11 kg/m   Physical Exam  Constitutional: He is oriented to person, place, and time. He appears well-developed  and well-nourished. No distress.  HENT:  Head: Normocephalic and atraumatic.  Mouth/Throat: Oropharynx is clear and moist.  Eyes: Conjunctivae and EOM are normal. Pupils are equal, round, and reactive to light.  Neck: Normal range of motion.  Cardiovascular: Normal rate, regular rhythm and intact distal pulses.   Pulmonary/Chest: Effort normal and breath sounds normal.  Abdominal: Soft. There is no tenderness.  Musculoskeletal: Normal range of motion. He exhibits edema and tenderness.       Feet:  Mild edema and TTP , no overlying skin changes.  Neurological: He is alert and oriented to person, place, and time.  Skin: He is not diaphoretic.  Psychiatric: He has a normal mood and affect.  Nursing note and vitals reviewed.     ED Treatments / Results  Labs (all labs ordered are listed, but only abnormal results are displayed) Labs Reviewed - No data to display  EKG  EKG Interpretation None       Radiology Dg Foot Complete Right  Result Date: 08/21/2016 CLINICAL DATA:  Right foot pain and swelling EXAM: RIGHT FOOT COMPLETE - 3+ VIEW COMPARISON:  None. FINDINGS: No subluxation. Ossific opacity adjacent to the lower aspect of the lateral cuboid bone, could represent a fracture. No subluxation. No radiopaque foreign body. IMPRESSION: Possible cuboid bone fracture. Electronically Signed   By: Donavan Foil M.D.   On: 08/21/2016 19:46    Procedures Procedures (including critical care time)  Medications Ordered in ED Medications  ketorolac (TORADOL) injection 30 mg (30 mg Intramuscular Given 08/21/16 1926)     Initial Impression / Assessment and Plan / ED Course  I have reviewed the triage vital signs and the nursing notes.  Pertinent labs & imaging results that were available during my care of the patient were reviewed by me and considered in my medical decision making (see chart for details).     Vitals:   08/21/16 1900 08/21/16 2245  BP: (!) 153/87 132/83  Pulse: 78  78  Resp: 16   Temp: 97.9 F (36.6 C)   TempSrc: Oral   SpO2: 99% 97%  Weight: 77.1 kg (170 lb)   Height: 5\' 3"  (1.6 m)     Medications  ketorolac (TORADOL) injection 30 mg (30 mg Intramuscular Given 08/21/16 1926)    Jemal Miskell is 51 y.o. male presenting with Pain to the plantar aspect of the right foot near the heel. He is pain was worse in the morning and then he pushed off while playing with his children yesterday and the pain became severe. Likely acute exacerbation of a plantar fasciitis. Patient given Toradol, will obtain x-ray.  X-ray with possible cuboid bone fracture. Patient put in a Cam Walker given crutches and orthopedic follow-up.  Discussed case with attending physician who  agrees with care plan and disposition.   Evaluation does not show pathology that would require ongoing emergent intervention or inpatient treatment. Pt is hemodynamically stable and mentating appropriately. Discussed findings and plan with patient/guardian, who agrees with care plan. All questions answered. Return precautions discussed and outpatient follow up given.    Final Clinical Impressions(s) / ED Diagnoses   Final diagnoses:  Closed nondisplaced fracture of cuboid of right foot, initial encounter    New Prescriptions Discharge Medication List as of 08/21/2016  8:23 PM       Ryann Leavitt, Charna Elizabeth 08/21/16 2307    Carmin Muskrat, MD 08/22/16 0006

## 2016-08-21 NOTE — Progress Notes (Signed)
Orthopedic Tech Progress Note Patient Details:  [place] Mar 01, 1965 [id number]  Ortho Devices Type of Ortho Device: CAM walker, Crutches Ortho Device/Splint Location: rle Ortho Device/Splint Interventions: Ordered, Application, Adjustment   Karolee Stamps 08/21/2016, 8:34 PM

## 2016-08-21 NOTE — Discharge Instructions (Signed)
Take vicodin for breakthrough pain, do not drink alcohol, drive, care for children or do other critical tasks while taking vicodin. ° °Please follow with your primary care doctor in the next 2 days for a check-up. They must obtain records for further management.  ° °Do not hesitate to return to the Emergency Department for any new, worsening or concerning symptoms.  ° °

## 2016-08-23 ENCOUNTER — Ambulatory Visit (INDEPENDENT_AMBULATORY_CARE_PROVIDER_SITE_OTHER): Payer: Medicaid Other | Admitting: Orthopaedic Surgery

## 2016-08-23 ENCOUNTER — Encounter (INDEPENDENT_AMBULATORY_CARE_PROVIDER_SITE_OTHER): Payer: Self-pay | Admitting: Orthopaedic Surgery

## 2016-08-23 DIAGNOSIS — S93691A Other sprain of right foot, initial encounter: Secondary | ICD-10-CM

## 2016-08-23 NOTE — Progress Notes (Signed)
   Office Visit Note   Patient: Marcus Mason           Date of Birth: [birth year]           MRN: [id number] Visit Date: 08/23/2016              Requested by: [name], MD [address] [place], [place] [address] PCP: [name], MD   Assessment & Plan: Visit Diagnoses:  1. Rupture of plantar fascia of right foot, initial encounter     Plan: Overall impression is right plantar fascial rupture. Recommend Cam Walker for 4-6 weeks wean crutches as needed. Pain medicines as needed. Follow-up in 6 weeks.  Follow-Up Instructions: Return in about 6 weeks (around 10/04/2016).   Orders:  No orders of the defined types were placed in this encounter.  No orders of the defined types were placed in this encounter.     Procedures: No procedures performed   Clinical Data: No additional findings.   Subjective: Chief Complaint  Patient presents with  . Right Foot - Pain, Injury    Patient is a 51 year old woman comes in with acute right foot pain from playing with his kids. He was pushing off and he felt a pop in the sole of his foot and x-rays were essentially negative. He endorses pain in the bottom of his foot. The who does make it better. The pain does not radiate.    Review of Systems  Constitutional: Negative.   All other systems reviewed and are negative.    Objective: Vital Signs: There were no vitals taken for this visit.  Physical Exam  Constitutional: He is oriented to person, place, and time. He appears well-developed and well-nourished.  HENT:  Head: Normocephalic and atraumatic.  Eyes: Pupils are equal, round, and reactive to light.  Neck: Neck supple.  Pulmonary/Chest: Effort normal.  Abdominal: Soft.  Musculoskeletal: Normal range of motion.  Neurological: He is alert and oriented to person, place, and time.  Skin: Skin is warm.  Psychiatric: He has a normal mood and affect. His behavior is normal. Judgment and thought  content normal.  Nursing note and vitals reviewed.   Ortho Exam Right foot exam shows no tenderness to palpation over the cuboid bone. He has mild bruising in the arch of his foot. This is tender to palpation. Specialty Comments:  No specialty comments available.  Imaging: No results found.   PMFS History: Patient Active Problem List   Diagnosis Date Noted  . Diverticulitis of colon (without mention of hemorrhage)(562.11) 11/16/2013  . Personal history of colonic polyp - adenoma 07/23/2013  . Diverticulosis of colon (without mention of hemorrhage) 07/23/2013  . Hemorrhoids, internal, with bleeding 07/23/2013   Past Medical History:  Diagnosis Date  . Diverticulitis   . Fatty liver   . Personal history of colonic polyp - adenoma 07/23/2013    Family History  Problem Relation Age of Onset  . Diabetes Maternal Aunt        x2    Past Surgical History:  Procedure Laterality Date  . COLONOSCOPY W/ BIOPSIES  07/23/2013   Social History   Occupational History  . Handyman    Social History Main Topics  . Smoking status: Never Smoker  . Smokeless tobacco: Never Used  . Alcohol use Yes     Comment: RARELY  . Drug use: No  . Sexual activity: Yes

## 2016-10-08 ENCOUNTER — Ambulatory Visit (INDEPENDENT_AMBULATORY_CARE_PROVIDER_SITE_OTHER): Payer: Medicaid Other | Admitting: Orthopaedic Surgery

## 2016-10-08 ENCOUNTER — Encounter (INDEPENDENT_AMBULATORY_CARE_PROVIDER_SITE_OTHER): Payer: Self-pay | Admitting: Orthopaedic Surgery

## 2016-10-08 DIAGNOSIS — S93691A Other sprain of right foot, initial encounter: Secondary | ICD-10-CM

## 2016-10-08 NOTE — Progress Notes (Signed)
   Office Visit Note   Patient: Marcus Mason           Date of Birth: 05/30/1965           MRN: [id number] Visit Date: 10/08/2016              Requested by: [name], MD [address] [place], [place] [address] PCP: [name], MD   Assessment & Plan: Visit Diagnoses:  1. Rupture of plantar fascia of right foot, initial encounter     Plan: Overall patient is improving from his plantar fascial rupture. Physical therapy referral was made today. Tylenol as needed. Over-the-counter insole. Follow-up as needed.  Follow-Up Instructions: Return if symptoms worsen or fail to improve.   Orders:  No orders of the defined types were placed in this encounter.  No orders of the defined types were placed in this encounter.     Procedures: No procedures performed   Clinical Data: No additional findings.   Subjective: Chief Complaint  Patient presents with  . Right Foot - Pain, Follow-up    Patient is following up today for his plantar fascial rupture. He is overall getting better. He states that he does have occasional pain in the ankle and leg. He is taking Tylenol as needed. He is wearing regular shoes at this point.    Review of Systems  Constitutional: Negative.   All other systems reviewed and are negative.    Objective: Vital Signs: There were no vitals taken for this visit.  Physical Exam  Constitutional: He is oriented to person, place, and time. He appears well-developed and well-nourished.  Pulmonary/Chest: Effort normal.  Abdominal: Soft.  Neurological: He is alert and oriented to person, place, and time.  Skin: Skin is warm.  Psychiatric: He has a normal mood and affect. His behavior is normal. Judgment and thought content normal.  Nursing note and vitals reviewed.   Ortho Exam Right foot exam shows no significant swelling or bruising. He has good range of motion of his ankle. He is walking with regular shoes without a  limp. Specialty Comments:  No specialty comments available.  Imaging: No results found.   PMFS History: Patient Active Problem List   Diagnosis Date Noted  . Diverticulitis of colon (without mention of hemorrhage)(562.11) 11/16/2013  . Personal history of colonic polyp - adenoma 07/23/2013  . Diverticulosis of colon (without mention of hemorrhage) 07/23/2013  . Hemorrhoids, internal, with bleeding 07/23/2013   Past Medical History:  Diagnosis Date  . Diverticulitis   . Fatty liver   . Personal history of colonic polyp - adenoma 07/23/2013    Family History  Problem Relation Age of Onset  . Diabetes Maternal Aunt        x2    Past Surgical History:  Procedure Laterality Date  . COLONOSCOPY W/ BIOPSIES  07/23/2013   Social History   Occupational History  . Handyman    Social History Main Topics  . Smoking status: Never Smoker  . Smokeless tobacco: Never Used  . Alcohol use Yes     Comment: RARELY  . Drug use: No  . Sexual activity: Yes

## 2017-05-26 ENCOUNTER — Encounter (HOSPITAL_COMMUNITY): Payer: Self-pay | Admitting: Family Medicine

## 2017-05-26 ENCOUNTER — Ambulatory Visit (HOSPITAL_COMMUNITY)
Admission: EM | Admit: 2017-05-26 | Discharge: 2017-05-26 | Disposition: A | Discharge status: Home / Self Care | Payer: Self-pay | Attending: Nurse Practitioner | Admitting: Nurse Practitioner

## 2017-05-26 DIAGNOSIS — K047 Periapical abscess without sinus: Secondary | ICD-10-CM

## 2017-05-26 DIAGNOSIS — K029 Dental caries, unspecified: Secondary | ICD-10-CM

## 2017-05-26 MED ORDER — IBUPROFEN 800 MG PO TABS: 800.0000 mg | ORAL_TABLET | Freq: Three times a day (TID) | ORAL | 0 refills | Status: DC

## 2017-05-26 MED ORDER — AMOXICILLIN-POT CLAVULANATE 875-125 MG PO TABS: 1.0000 | ORAL_TABLET | Freq: Two times a day (BID) | ORAL | 0 refills | Status: DC

## 2017-05-26 NOTE — Discharge Instructions (Signed)
Take antibiotics as prescribed. Ibuprofen three times a day as needed for pain. Follow-up with your dentist as scheduled on 06/08/17.

## 2017-05-26 NOTE — ED Provider Notes (Signed)
[place]    CSN: [id number] Arrival date & time: 05/26/17  1327     History   Chief Complaint Chief Complaint  Patient presents with  . Dental Pain    HPI Marcus Mason is a 52 y.o. male.    Marcus Mason is a 52 y.o. Male who presents with complaints of dental pain. Onset of symptoms was  2 days ago. Patient describes pain as aching and throbbing. Pain severity now is severe. The pain radiates to his left face and left ear. He also endorses headache, jaw swelling and chills. Pain is aggravated by use. Pain is alleviated by nothing. The patient denies any fevers or difficulty swallowing. The patient has not sought treatment by another care provider for this problem but he does have an appointment to the dentist on 06/08/17.         Past Medical History:  Diagnosis Date  . Diverticulitis   . Fatty liver   . Personal history of colonic polyp - adenoma 07/23/2013    Patient Active Problem List   Diagnosis Date Noted  . Diverticulitis of colon (without mention of hemorrhage)(562.11) 11/16/2013  . Personal history of colonic polyp - adenoma 07/23/2013  . Diverticulosis of colon (without mention of hemorrhage) 07/23/2013  . Hemorrhoids, internal, with bleeding 07/23/2013    Past Surgical History:  Procedure Laterality Date  . COLONOSCOPY W/ BIOPSIES  07/23/2013       Home Medications    Prior to Admission medications   Medication Sig Start Date End Date Taking? Authorizing Provider  acetaminophen (TYLENOL) 500 MG tablet Take 1,000 mg by mouth every 6 (six) hours as needed for mild pain.    [provider]  aspirin EC 325 MG tablet Take 650 mg by mouth daily as needed (headache).    [provider]  ciprofloxacin (CIPRO) 500 MG tablet Take 1 tablet (500 mg total) by mouth 2 (two) times daily. Patient not taking: Reported on 08/23/2016 06/26/15   Veryl Speak, MD  FIBER PO Take 1 capsule by mouth 3 (three) times daily.    [provider]  HYDROcodone-acetaminophen (NORCO/VICODIN) 5-325 MG tablet Take 1-2 tablets by mouth every 6 hours as needed for pain. Patient not taking: Reported on 08/23/2016 08/21/16   Pisciotta, Elmyra Ricks, PA-C  hyoscyamine (LEVSIN/SL) 0.125 MG SL tablet Place 1 tablet (0.125 mg total) under the tongue every 4 (four) hours as needed. Patient not taking: Reported on 10/08/2016 05/16/15   Palumbo, April, MD  ibuprofen (ADVIL,MOTRIN) 600 MG tablet Take 1 tablet (600 mg total) by mouth every 6 (six) hours as needed. Patient not taking: Reported on 03/09/2015 09/04/14   Jeannett Senior, PA-C  metroNIDAZOLE (FLAGYL) 500 MG tablet Take 1 tablet (500 mg total) by mouth 3 (three) times daily. Patient not taking: Reported on 08/23/2016 06/26/15   Veryl Speak, MD  ondansetron (ZOFRAN) 4 MG tablet Take 1 tablet (4 mg total) by mouth every 6 (six) hours. Patient not taking: Reported on 05/15/2015 03/10/15   [name], PA-C  oxyCODONE-acetaminophen (PERCOCET/ROXICET) 5-325 MG tablet Take 1 tablet by mouth every 6 (six) hours as needed. Patient not taking: Reported on 05/15/2015 03/10/15   [name], PA-C  polyethylene glycol [facility]) packet Take 17 g by mouth daily. Patient not taking: Reported on 08/23/2016 05/16/15   Palumbo, April, MD  polyethylene glycol [facility]) packet Take one pack up to three times daily until bowels move, maximum of 3 consecutive days Patient not taking: Reported on 08/23/2016  11/25/15   Charlann Lange, PA-C    Family History Family History  Problem Relation Age of Onset  . Diabetes Maternal Aunt        x2    Social History Social History   Tobacco Use  . Smoking status: Never Smoker  . Smokeless tobacco: Never Used  Substance Use Topics  . Alcohol use: Yes    Comment: RARELY  . Drug use: No     Allergies   Codeine   Review of Systems Review of Systems  Constitutional: Positive for chills. Negative for fever.  HENT: Positive for dental problem.     Neurological: Positive for headaches.  All other systems reviewed and are negative.    Physical Exam Triage Vital Signs ED Triage Vitals [05/26/17 1409]  Enc Vitals Group     BP (!) 157/100     Pulse Rate 75     Resp 18     Temp 98.6 F (37 C)     Temp src      SpO2 100 %     Weight      Height      Head Circumference      Peak Flow      Pain Score      Pain Loc      Pain Edu?      Excl. in [place]?    No data found.  Updated Vital Signs BP (!) 157/100   Pulse 75   Temp 98.6 F (37 C)   Resp 18   SpO2 100%   Visual Acuity Right Eye Distance:   Left Eye Distance:   Bilateral Distance:    Right Eye Near:   Left Eye Near:    Bilateral Near:     Physical Exam  Constitutional: He is oriented to person, place, and time. He appears well-developed and well-nourished.  HENT:  Mouth/Throat: Oropharynx is clear and moist. No oropharyngeal exudate.  Severe dental caries noted to left lower molars. No obvious dental abscess noted. Tongue, lips and gums pink and moist without any lesions. Left facial swelling noted.   Neck: Normal range of motion. Neck supple.  Cardiovascular: Normal rate and regular rhythm.  Pulmonary/Chest: Effort normal and breath sounds normal.  Musculoskeletal: Normal range of motion.  Neurological: He is alert and oriented to person, place, and time.  Skin: Skin is warm and dry.  Psychiatric: He has a normal mood and affect.     UC Treatments / Results  Labs (all labs ordered are listed, but only abnormal results are displayed) Labs Reviewed - No data to display  EKG None Radiology No results found.  Procedures Procedures (including critical care time)  Medications Ordered in UC Medications - No data to display   Initial Impression / Assessment and Plan / UC Course  I have reviewed the triage vital signs and the nursing notes.  Pertinent labs & imaging results that were available during my care of the patient were reviewed by me and  considered in my medical decision making (see chart for details).     52 y.o. Male who presents with complaints with a two-day history of worsening of dental pain. Exam reveals severe dental caries to the left lower molars as well as left jaw swelling. No obvious dental abscess noted. Tongue, lips and gums pink and moist without any lesions. Will start Augmentin 875 mg BID x 7 days. Patient advised to follow-up with dentistry as scheduled on 06/08/17. Discussed diagnosis and treatment with patient.  All questions have been answered and all concerns have been addressed. The patient verbalized understanding and had no further questions   Final Clinical Impressions(s) / UC Diagnoses   Final diagnoses:  Infected dental caries    ED Discharge Orders    None       Controlled Substance Prescriptions [place] Controlled Substance Registry consulted? Not Applicable   Enrique Sack, [place] 05/26/17 1511

## 2017-05-26 NOTE — ED Triage Notes (Signed)
Pt here for dental pain and oral swelling to right lower mouth. Taking tylenol for pain relief.

## 2018-06-19 ENCOUNTER — Emergency Department: Payer: No Typology Code available for payment source

## 2018-06-19 ENCOUNTER — Encounter: Payer: Self-pay | Admitting: Emergency Medicine

## 2018-06-19 ENCOUNTER — Other Ambulatory Visit: Payer: Self-pay

## 2018-06-19 ENCOUNTER — Emergency Department
Admission: EM | Admit: 2018-06-19 | Discharge: 2018-06-19 | Disposition: A | Discharge status: Home / Self Care | Payer: No Typology Code available for payment source | Attending: Emergency Medicine | Admitting: Emergency Medicine

## 2018-06-19 DIAGNOSIS — M542 Cervicalgia: Secondary | ICD-10-CM | POA: Diagnosis not present

## 2018-06-19 DIAGNOSIS — R1012 Left upper quadrant pain: Secondary | ICD-10-CM | POA: Insufficient documentation

## 2018-06-19 DIAGNOSIS — Z79899 Other long term (current) drug therapy: Secondary | ICD-10-CM | POA: Insufficient documentation

## 2018-06-19 DIAGNOSIS — Y92410 Unspecified street and highway as the place of occurrence of the external cause: Secondary | ICD-10-CM | POA: Insufficient documentation

## 2018-06-19 DIAGNOSIS — R202 Paresthesia of skin: Secondary | ICD-10-CM | POA: Diagnosis not present

## 2018-06-19 DIAGNOSIS — Y999 Unspecified external cause status: Secondary | ICD-10-CM | POA: Diagnosis not present

## 2018-06-19 DIAGNOSIS — R51 Headache: Secondary | ICD-10-CM | POA: Diagnosis not present

## 2018-06-19 DIAGNOSIS — Y9389 Activity, other specified: Secondary | ICD-10-CM | POA: Diagnosis not present

## 2018-06-19 DIAGNOSIS — R200 Anesthesia of skin: Secondary | ICD-10-CM | POA: Insufficient documentation

## 2018-06-19 LAB — COMPREHENSIVE METABOLIC PANEL
ALT: 27 U/L (ref 0–44)
AST: 23 U/L (ref 15–41)
Albumin: 4.5 g/dL (ref 3.5–5.0)
Alkaline Phosphatase: 99 U/L (ref 38–126)
Anion gap: 10 (ref 5–15)
BUN: 14 mg/dL (ref 6–20)
CO2: 24 mmol/L (ref 22–32)
Calcium: 8.8 mg/dL — ABNORMAL LOW (ref 8.9–10.3)
Chloride: 102 mmol/L (ref 98–111)
Creatinine, Ser: 0.61 mg/dL (ref 0.61–1.24)
GFR calc Af Amer: >60 mL/min (ref >60)
GFR calc non Af Amer: >60 mL/min (ref >60)
Glucose, Bld: 341 mg/dL — ABNORMAL HIGH (ref 70–99)
Potassium: 3.8 mmol/L (ref 3.5–5.1)
Sodium: 136 mmol/L (ref 135–145)
Total Bilirubin: 0.7 mg/dL (ref 0.3–1.2)
Total Protein: 7.3 g/dL (ref 6.5–8.1)

## 2018-06-19 LAB — CBC WITH DIFFERENTIAL/PLATELET
Abs Immature Granulocytes: 0.03 10*3/uL (ref 0.00–0.07)
Basophils Absolute: 0.1 10*3/uL (ref 0.0–0.1)
Basophils Relative: 1 %
Eosinophils Absolute: 0.1 10*3/uL (ref 0.0–0.5)
Eosinophils Relative: 1 %
HCT: 50.7 % (ref 39.0–52.0)
Hemoglobin: 17.1 g/dL — ABNORMAL HIGH (ref 13.0–17.0)
Immature Granulocytes: 0 %
Lymphocytes Relative: 29 %
Lymphs Abs: 2.6 10*3/uL (ref 0.7–4.0)
MCH: 29.8 pg (ref 26.0–34.0)
MCHC: 33.7 g/dL (ref 30.0–36.0)
MCV: 88.3 fL (ref 80.0–100.0)
Monocytes Absolute: 0.6 10*3/uL (ref 0.1–1.0)
Monocytes Relative: 7 %
Neutro Abs: 5.6 10*3/uL (ref 1.7–7.7)
Neutrophils Relative %: 62 %
Platelets: 262 10*3/uL (ref 150–400)
RBC: 5.74 MIL/uL (ref 4.22–5.81)
RDW: 12.2 % (ref 11.5–15.5)
WBC: 8.9 10*3/uL (ref 4.0–10.5)
nRBC: 0 % (ref 0.0–0.2)

## 2018-06-19 LAB — GLUCOSE, CAPILLARY: Glucose-Capillary: 208 mg/dL — ABNORMAL HIGH (ref 70–99)

## 2018-06-19 MED ORDER — OXYCODONE-ACETAMINOPHEN 5-325 MG PO TABS
1.0000 | ORAL_TABLET | Freq: Once | ORAL | Status: AC
  Administered 2018-06-19: 1 via ORAL
  Filled 2018-06-19: qty 1

## 2018-06-19 MED ORDER — MELOXICAM 15 MG PO TABS: 15.0000 mg | ORAL_TABLET | Freq: Every day | ORAL | 1 refills | Status: AC

## 2018-06-19 MED ORDER — METHOCARBAMOL 500 MG PO TABS: 500.0000 mg | ORAL_TABLET | Freq: Three times a day (TID) | ORAL | 0 refills | Status: AC | PRN

## 2018-06-19 MED ORDER — ONDANSETRON 4 MG PO TBDP
4.0000 mg | ORAL_TABLET | Freq: Once | ORAL | Status: AC
  Administered 2018-06-19: 17:00:00 4 mg via ORAL
  Filled 2018-06-19: qty 1

## 2018-06-19 MED ORDER — IOHEXOL 300 MG/ML  SOLN
100.0000 mL | Freq: Once | INTRAMUSCULAR | Status: AC | PRN
  Administered 2018-06-19: 18:00:00 100 mL via INTRAVENOUS
  Filled 2018-06-19: qty 100

## 2018-06-19 MED ORDER — SODIUM CHLORIDE 0.9 % IV BOLUS
1000.0000 mL | Freq: Once | INTRAVENOUS | Status: AC
  Administered 2018-06-19: 19:00:00 1000 mL via INTRAVENOUS
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
## 2018-06-19 NOTE — ED Notes (Signed)
Pt given water at this time 

## 2018-07-14 ENCOUNTER — Encounter: Payer: Self-pay | Admitting: Internal Medicine

## 2019-03-01 ENCOUNTER — Other Ambulatory Visit: Payer: Medicaid Other

## 2019-03-01 ENCOUNTER — Ambulatory Visit: Payer: Medicaid Other | Attending: Internal Medicine

## 2019-03-01 DIAGNOSIS — Z20822 Contact with and (suspected) exposure to covid-19: Secondary | ICD-10-CM

## 2019-03-06 LAB — NOVEL CORONAVIRUS, NAA

## 2019-03-08 ENCOUNTER — Ambulatory Visit: Payer: Medicaid Other | Attending: Internal Medicine

## 2019-03-08 DIAGNOSIS — Z20822 Contact with and (suspected) exposure to covid-19: Secondary | ICD-10-CM

## 2019-03-09 LAB — NOVEL CORONAVIRUS, NAA: SARS-CoV-2, NAA: NOT DETECTED

## 2022-01-22 DIAGNOSIS — Z419 Encounter for procedure for purposes other than remedying health state, unspecified: Secondary | ICD-10-CM | POA: Diagnosis not present

## 2022-02-22 DIAGNOSIS — Z419 Encounter for procedure for purposes other than remedying health state, unspecified: Secondary | ICD-10-CM | POA: Diagnosis not present

## 2022-03-23 DIAGNOSIS — E7800 Pure hypercholesterolemia, unspecified: Secondary | ICD-10-CM | POA: Diagnosis not present

## 2022-03-23 DIAGNOSIS — E119 Type 2 diabetes mellitus without complications: Secondary | ICD-10-CM | POA: Diagnosis not present

## 2022-03-25 DIAGNOSIS — Z419 Encounter for procedure for purposes other than remedying health state, unspecified: Secondary | ICD-10-CM | POA: Diagnosis not present

## 2022-04-23 DIAGNOSIS — Z419 Encounter for procedure for purposes other than remedying health state, unspecified: Secondary | ICD-10-CM | POA: Diagnosis not present

## 2022-05-24 DIAGNOSIS — Z419 Encounter for procedure for purposes other than remedying health state, unspecified: Secondary | ICD-10-CM | POA: Diagnosis not present

## 2022-06-23 DIAGNOSIS — Z419 Encounter for procedure for purposes other than remedying health state, unspecified: Secondary | ICD-10-CM | POA: Diagnosis not present

## 2022-07-24 DIAGNOSIS — Z419 Encounter for procedure for purposes other than remedying health state, unspecified: Secondary | ICD-10-CM | POA: Diagnosis not present

## 2022-08-05 DIAGNOSIS — L509 Urticaria, unspecified: Secondary | ICD-10-CM | POA: Diagnosis not present

## 2022-08-06 DIAGNOSIS — Z85850 Personal history of malignant neoplasm of thyroid: Secondary | ICD-10-CM | POA: Diagnosis not present

## 2022-08-06 DIAGNOSIS — E119 Type 2 diabetes mellitus without complications: Secondary | ICD-10-CM | POA: Diagnosis not present

## 2022-08-06 DIAGNOSIS — Z08 Encounter for follow-up examination after completed treatment for malignant neoplasm: Secondary | ICD-10-CM | POA: Diagnosis not present

## 2022-08-06 DIAGNOSIS — Z794 Long term (current) use of insulin: Secondary | ICD-10-CM | POA: Diagnosis not present

## 2022-08-06 DIAGNOSIS — C73 Malignant neoplasm of thyroid gland: Secondary | ICD-10-CM | POA: Diagnosis not present

## 2022-08-06 DIAGNOSIS — E890 Postprocedural hypothyroidism: Secondary | ICD-10-CM | POA: Diagnosis not present

## 2022-08-17 DIAGNOSIS — C73 Malignant neoplasm of thyroid gland: Secondary | ICD-10-CM | POA: Diagnosis not present

## 2022-08-17 DIAGNOSIS — C760 Malignant neoplasm of head, face and neck: Secondary | ICD-10-CM | POA: Diagnosis not present

## 2022-08-23 DIAGNOSIS — Z419 Encounter for procedure for purposes other than remedying health state, unspecified: Secondary | ICD-10-CM | POA: Diagnosis not present

## 2022-09-06 DIAGNOSIS — M25562 Pain in left knee: Secondary | ICD-10-CM | POA: Diagnosis not present

## 2022-09-06 DIAGNOSIS — M25561 Pain in right knee: Secondary | ICD-10-CM | POA: Diagnosis not present

## 2022-09-19 DIAGNOSIS — I10 Essential (primary) hypertension: Secondary | ICD-10-CM | POA: Diagnosis not present

## 2022-09-19 DIAGNOSIS — K047 Periapical abscess without sinus: Secondary | ICD-10-CM | POA: Diagnosis not present

## 2022-09-19 DIAGNOSIS — K029 Dental caries, unspecified: Secondary | ICD-10-CM | POA: Diagnosis not present

## 2022-09-23 DIAGNOSIS — Z419 Encounter for procedure for purposes other than remedying health state, unspecified: Secondary | ICD-10-CM | POA: Diagnosis not present

## 2022-10-18 DIAGNOSIS — Z978 Presence of other specified devices: Secondary | ICD-10-CM | POA: Diagnosis not present

## 2022-10-18 DIAGNOSIS — C73 Malignant neoplasm of thyroid gland: Secondary | ICD-10-CM | POA: Diagnosis not present

## 2022-10-18 DIAGNOSIS — E1165 Type 2 diabetes mellitus with hyperglycemia: Secondary | ICD-10-CM | POA: Diagnosis not present

## 2022-10-18 DIAGNOSIS — E039 Hypothyroidism, unspecified: Secondary | ICD-10-CM | POA: Diagnosis not present

## 2022-10-24 DIAGNOSIS — Z419 Encounter for procedure for purposes other than remedying health state, unspecified: Secondary | ICD-10-CM | POA: Diagnosis not present

## 2022-11-23 DIAGNOSIS — Z419 Encounter for procedure for purposes other than remedying health state, unspecified: Secondary | ICD-10-CM | POA: Diagnosis not present

## 2022-12-20 DIAGNOSIS — R809 Proteinuria, unspecified: Secondary | ICD-10-CM | POA: Diagnosis not present

## 2022-12-20 DIAGNOSIS — E890 Postprocedural hypothyroidism: Secondary | ICD-10-CM | POA: Diagnosis not present

## 2022-12-20 DIAGNOSIS — S161XXS Strain of muscle, fascia and tendon at neck level, sequela: Secondary | ICD-10-CM | POA: Diagnosis not present

## 2022-12-20 DIAGNOSIS — E1129 Type 2 diabetes mellitus with other diabetic kidney complication: Secondary | ICD-10-CM | POA: Diagnosis not present

## 2022-12-20 DIAGNOSIS — I10 Essential (primary) hypertension: Secondary | ICD-10-CM | POA: Diagnosis not present

## 2022-12-20 DIAGNOSIS — Z0000 Encounter for general adult medical examination without abnormal findings: Secondary | ICD-10-CM | POA: Diagnosis not present

## 2022-12-24 DIAGNOSIS — Z419 Encounter for procedure for purposes other than remedying health state, unspecified: Secondary | ICD-10-CM | POA: Diagnosis not present

## 2023-01-23 DIAGNOSIS — Z419 Encounter for procedure for purposes other than remedying health state, unspecified: Secondary | ICD-10-CM | POA: Diagnosis not present

## 2023-02-08 DIAGNOSIS — C73 Malignant neoplasm of thyroid gland: Secondary | ICD-10-CM | POA: Diagnosis not present

## 2023-02-08 DIAGNOSIS — Z08 Encounter for follow-up examination after completed treatment for malignant neoplasm: Secondary | ICD-10-CM | POA: Diagnosis not present

## 2023-02-09 DIAGNOSIS — C73 Malignant neoplasm of thyroid gland: Secondary | ICD-10-CM | POA: Diagnosis not present

## 2023-02-09 DIAGNOSIS — Z08 Encounter for follow-up examination after completed treatment for malignant neoplasm: Secondary | ICD-10-CM | POA: Diagnosis not present

## 2023-02-09 DIAGNOSIS — Z85850 Personal history of malignant neoplasm of thyroid: Secondary | ICD-10-CM | POA: Diagnosis not present

## 2023-02-23 DIAGNOSIS — Z419 Encounter for procedure for purposes other than remedying health state, unspecified: Secondary | ICD-10-CM | POA: Diagnosis not present

## 2023-03-26 DIAGNOSIS — Z419 Encounter for procedure for purposes other than remedying health state, unspecified: Secondary | ICD-10-CM | POA: Diagnosis not present

## 2023-04-23 DIAGNOSIS — Z419 Encounter for procedure for purposes other than remedying health state, unspecified: Secondary | ICD-10-CM | POA: Diagnosis not present

## 2023-06-04 DIAGNOSIS — Z419 Encounter for procedure for purposes other than remedying health state, unspecified: Secondary | ICD-10-CM | POA: Diagnosis not present

## 2023-07-04 DIAGNOSIS — Z419 Encounter for procedure for purposes other than remedying health state, unspecified: Secondary | ICD-10-CM | POA: Diagnosis not present

## 2023-07-21 DIAGNOSIS — E039 Hypothyroidism, unspecified: Secondary | ICD-10-CM | POA: Diagnosis not present

## 2023-07-21 DIAGNOSIS — I10 Essential (primary) hypertension: Secondary | ICD-10-CM | POA: Diagnosis not present

## 2023-07-21 DIAGNOSIS — C73 Malignant neoplasm of thyroid gland: Secondary | ICD-10-CM | POA: Diagnosis not present

## 2023-07-21 DIAGNOSIS — Z978 Presence of other specified devices: Secondary | ICD-10-CM | POA: Diagnosis not present

## 2023-07-21 DIAGNOSIS — E119 Type 2 diabetes mellitus without complications: Secondary | ICD-10-CM | POA: Diagnosis not present

## 2023-07-21 DIAGNOSIS — Z833 Family history of diabetes mellitus: Secondary | ICD-10-CM | POA: Diagnosis not present

## 2023-07-21 DIAGNOSIS — Z79890 Hormone replacement therapy (postmenopausal): Secondary | ICD-10-CM | POA: Diagnosis not present

## 2023-08-04 DIAGNOSIS — Z419 Encounter for procedure for purposes other than remedying health state, unspecified: Secondary | ICD-10-CM | POA: Diagnosis not present

## 2023-08-09 DIAGNOSIS — C73 Malignant neoplasm of thyroid gland: Secondary | ICD-10-CM | POA: Diagnosis not present

## 2023-08-09 DIAGNOSIS — Z08 Encounter for follow-up examination after completed treatment for malignant neoplasm: Secondary | ICD-10-CM | POA: Diagnosis not present

## 2023-08-09 DIAGNOSIS — Z85850 Personal history of malignant neoplasm of thyroid: Secondary | ICD-10-CM | POA: Diagnosis not present

## 2023-09-03 DIAGNOSIS — Z419 Encounter for procedure for purposes other than remedying health state, unspecified: Secondary | ICD-10-CM | POA: Diagnosis not present

## 2023-10-04 DIAGNOSIS — Z419 Encounter for procedure for purposes other than remedying health state, unspecified: Secondary | ICD-10-CM | POA: Diagnosis not present

## 2023-11-04 DIAGNOSIS — Z419 Encounter for procedure for purposes other than remedying health state, unspecified: Secondary | ICD-10-CM | POA: Diagnosis not present

## 2023-11-04 DIAGNOSIS — J029 Acute pharyngitis, unspecified: Secondary | ICD-10-CM | POA: Diagnosis not present

## 2023-11-04 DIAGNOSIS — L237 Allergic contact dermatitis due to plants, except food: Secondary | ICD-10-CM | POA: Diagnosis not present
